# Patient Record
Sex: Male | Born: 1985
Health system: Southern US, Community
[De-identification: ages and names within clinical notes are randomized; demographics above are authoritative.]

## PROBLEM LIST (undated history)

## (undated) DIAGNOSIS — G4700 Insomnia, unspecified: Secondary | ICD-10-CM

## (undated) DIAGNOSIS — I517 Cardiomegaly: Principal | ICD-10-CM

## (undated) DIAGNOSIS — I4510 Unspecified right bundle-branch block: Secondary | ICD-10-CM

## (undated) HISTORY — PX: TONSILLECTOMY: SUR1361

## (undated) HISTORY — PX: HERNIA REPAIR: SHX51

## (undated) HISTORY — DX: Insomnia, unspecified: G47.00

## (undated) HISTORY — DX: Unspecified right bundle-branch block: I45.10

## (undated) HISTORY — DX: Cardiomegaly: I51.7

---

## 2010-06-26 DIAGNOSIS — G4700 Insomnia, unspecified: Secondary | ICD-10-CM | POA: Insufficient documentation

## 2016-03-29 MED FILL — AZITHROMYCIN 250 MG TABLET: 250 | 5 days supply | Qty: 6 | Fill #0

## 2016-03-31 ENCOUNTER — Encounter (HOSPITAL_COMMUNITY): Payer: Self-pay | Admitting: Emergency Medicine

## 2016-03-31 ENCOUNTER — Ambulatory Visit (HOSPITAL_COMMUNITY)
Admission: EM | Admit: 2016-03-31 | Discharge: 2016-03-31 | Disposition: A | Discharge status: Home / Self Care | Payer: 59 | Attending: Emergency Medicine | Admitting: Emergency Medicine

## 2016-03-31 DIAGNOSIS — R0981 Nasal congestion: Secondary | ICD-10-CM

## 2016-03-31 DIAGNOSIS — R5383 Other fatigue: Secondary | ICD-10-CM

## 2016-03-31 DIAGNOSIS — J029 Acute pharyngitis, unspecified: Secondary | ICD-10-CM

## 2016-03-31 LAB — POCT INFECTIOUS MONO SCREEN: Mono Screen: NEGATIVE

## 2016-03-31 LAB — POCT RAPID STREP A: STREPTOCOCCUS, GROUP A SCREEN (DIRECT): NEGATIVE

## 2016-03-31 MED ORDER — METHYLPREDNISOLONE ACETATE 80 MG/ML IJ SUSP
INTRAMUSCULAR | Status: AC
  Filled 2016-03-31: qty 1

## 2016-03-31 MED ORDER — METHYLPREDNISOLONE ACETATE 80 MG/ML IJ SUSP
80.0000 mg | Freq: Once | INTRAMUSCULAR | Status: AC
  Administered 2016-03-31: 80 mg via INTRAMUSCULAR

## 2016-03-31 NOTE — Discharge Instructions (Signed)
Nice to meet you. You have no clinical or lab findings of strep or mono. Suspect viral infection and /or allergies. At any rate treat you with DepoMedrol injection which will help with all above and hopefully fatigue. Suggest ibuprofen 800mg  every 8 hours for soreness in throat as well, and perhaps a Claritin or zyrtec daily through this. Hope you feel better.   Pharyngitis Pharyngitis is redness, pain, and swelling (inflammation) of your pharynx.  CAUSES  Pharyngitis is usually caused by infection. Most of the time, these infections are from viruses (viral) and are part of a cold. However, sometimes pharyngitis is caused by bacteria (bacterial). Pharyngitis can also be caused by allergies. Viral pharyngitis may be spread from person to person by coughing, sneezing, and personal items or utensils (cups, forks, spoons, toothbrushes). Bacterial pharyngitis may be spread from person to person by more intimate contact, such as kissing.  SIGNS AND SYMPTOMS  Symptoms of pharyngitis include:   Sore throat.   Tiredness (fatigue).   Low-grade fever.   Headache.  Joint pain and muscle aches.  Skin rashes.  Swollen lymph nodes.  Plaque-like film on throat or tonsils (often seen with bacterial pharyngitis). DIAGNOSIS  Your health care provider will ask you questions about your illness and your symptoms. Your medical history, along with a physical exam, is often all that is needed to diagnose pharyngitis. Sometimes, a rapid strep test is done. Other lab tests may also be done, depending on the suspected cause.  TREATMENT  Viral pharyngitis will usually get better in 3-4 days without the use of medicine. Bacterial pharyngitis is treated with medicines that kill germs (antibiotics).  HOME CARE INSTRUCTIONS   Drink enough water and fluids to keep your urine clear or pale yellow.   Only take over-the-counter or prescription medicines as directed by your health care provider:   If you are  prescribed antibiotics, make sure you finish them even if you start to feel better.   Do not take aspirin.   Get lots of rest.   Gargle with 8 oz of salt water ( tsp of salt per 1 qt of water) as often as every 1-2 hours to soothe your throat.   Throat lozenges (if you are not at risk for choking) or sprays may be used to soothe your throat. SEEK MEDICAL CARE IF:   You have large, tender lumps in your neck.  You have a rash.  You cough up green, yellow-brown, or bloody spit. SEEK IMMEDIATE MEDICAL CARE IF:   Your neck becomes stiff.  You drool or are unable to swallow liquids.  You vomit or are unable to keep medicines or liquids down.  You have severe pain that does not go away with the use of recommended medicines.  You have trouble breathing (not caused by a stuffy nose). MAKE SURE YOU:   Understand these instructions.  Will watch your condition.  Will get help right away if you are not doing well or get worse.   This information is not intended to replace advice given to you by your health care provider. Make sure you discuss any questions you have with your health care provider.   Document Released: 11/12/2005 Document Revised: 09/02/2013 Document Reviewed: 07/20/2013 Elsevier Interactive Patient Education 2016 Elsevier Inc.  Sore Throat A sore throat is pain, burning, irritation, or scratchiness of the throat. There is often pain or tenderness when swallowing or talking. A sore throat may be accompanied by other symptoms, such as coughing, sneezing, fever, and  swollen neck glands. A sore throat is often the first sign of another sickness, such as a cold, flu, strep throat, or mononucleosis (commonly known as mono). Most sore throats go away without medical treatment. CAUSES  The most common causes of a sore throat include:  A viral infection, such as a cold, flu, or mono.  A bacterial infection, such as strep throat, tonsillitis, or whooping  cough.  Seasonal allergies.  Dryness in the air.  Irritants, such as smoke or pollution.  Gastroesophageal reflux disease (GERD). HOME CARE INSTRUCTIONS   Only take over-the-counter medicines as directed by your caregiver.  Drink enough fluids to keep your urine clear or pale yellow.  Rest as needed.  Try using throat sprays, lozenges, or sucking on hard candy to ease any pain (if older than 4 years or as directed).  Sip warm liquids, such as broth, herbal tea, or warm water with honey to relieve pain temporarily. You may also eat or drink cold or frozen liquids such as frozen ice pops.  Gargle with salt water (mix 1 tsp salt with 8 oz of water).  Do not smoke and avoid secondhand smoke.  Put a cool-mist humidifier in your bedroom at night to moisten the air. You can also turn on a hot shower and sit in the bathroom with the door closed for 5-10 minutes. SEEK IMMEDIATE MEDICAL CARE IF:  You have difficulty breathing.  You are unable to swallow fluids, soft foods, or your saliva.  You have increased swelling in the throat.  Your sore throat does not get better in 7 days.  You have nausea and vomiting.  You have a fever or persistent symptoms for more than 2-3 days.  You have a fever and your symptoms suddenly get worse. MAKE SURE YOU:   Understand these instructions.  Will watch your condition.  Will get help right away if you are not doing well or get worse.   This information is not intended to replace advice given to you by your health care provider. Make sure you discuss any questions you have with your health care provider.   Document Released: 12/20/2004 Document Revised: 12/03/2014 Document Reviewed: 07/20/2012 Elsevier Interactive Patient Education [employer].

## 2016-03-31 NOTE — ED Provider Notes (Signed)
CSN: [id number]     Arrival date & time 03/31/16  1427 History   First MD Initiated Contact with Patient 03/31/16 1618     Chief Complaint  Patient presents with  . Sore Throat   (Consider location/radiation/quality/duration/timing/severity/associated sxs/prior Treatment) HPI Comments: James Kelly is a very pleasant 30 yo male who presents with a sore throat. Onset 1 week. Recently treated with a z-pack for what was thought to be a strep (no test was performed). He finished the medication today and continues to have a sore throat. He worries about mono or reoccurring strep.He has fatigue. No fever or chills. Mild nasal congestion is noted. No headaches. No cough.    Patient is a 30 y.o. male presenting with pharyngitis. The history is provided by the patient.  Sore Throat Pertinent negatives include no shortness of breath.    History reviewed. No pertinent past medical history. History reviewed. No pertinent past surgical history. No family history on file. Social History  Substance Use Topics  . Smoking status: Never Smoker   . Smokeless tobacco: None  . Alcohol Use: Yes    Review of Systems  Constitutional: Positive for fatigue. Negative for fever and chills.  HENT: Positive for congestion, sinus pressure and sore throat.   Eyes: Negative.   Respiratory: Negative for cough and shortness of breath.   Skin: Negative.   Neurological: Negative.     Allergies  Review of patient's allergies indicates not on file.  Home Medications   Prior to Admission medications   Medication Sig Start Date End Date Taking? Authorizing Provider  azithromycin (ZITHROMAX) 250 MG tablet Take by mouth daily.   Yes Historical Provider, MD   Meds Ordered and Administered this Visit   Medications  methylPREDNISolone acetate (DEPO-MEDROL) injection 80 mg (not administered)    BP 122/74 mmHg  Pulse 53  Temp(Src) 98.2 F (36.8 C) (Oral)  Resp 16  SpO2 100% No data found.   Physical Exam   Constitutional: He is oriented to person, place, and time. He appears well-developed and well-nourished. No distress.  HENT:  Head: Normocephalic and atraumatic.  Right Ear: External ear normal.  Left Ear: External ear normal.  Mouth/Throat: No oropharyngeal exudate.  Oropharynx injected without exudate or swelling, nasal turbinates slightly swollen, clear drainage  Neck: Normal range of motion. Neck supple.  Cardiovascular: Normal rate and regular rhythm.   Pulmonary/Chest: Effort normal.  Lymphadenopathy:    He has no cervical adenopathy.  Neurological: He is alert and oriented to person, place, and time.  Skin: Skin is warm and dry. No rash noted. He is not diaphoretic.  Psychiatric: His behavior is normal.  Nursing note and vitals reviewed.   ED Course  Procedures (including critical care time)  Labs Review Labs Reviewed  POCT RAPID STREP A  POCT INFECTIOUS MONO SCREEN    Imaging Review No results found.   Visual Acuity Review  Right Eye Distance:   Left Eye Distance:   Bilateral Distance:    Right Eye Near:   Left Eye Near:    Bilateral Near:         MDM   1. Pharyngitis   2. Other fatigue   3. Nasal congestion    No indication for another abx, as he was treated approiatley  with abx prior. Suggest an IM DepoMedrol for viral illness, fatigue and ongoing congestion /possible allergies. FU prn    James Pippin, PA-C 03/31/16 1708

## 2016-03-31 NOTE — ED Notes (Signed)
Pt. Stated, I've had a sore throat and was treated with a Z-Pak I finished the last dose today.  I feel like my throat is worse. 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

## 2016-04-03 LAB — CULTURE, GROUP A STREP (THRC)

## 2016-05-06 ENCOUNTER — Emergency Department (INDEPENDENT_AMBULATORY_CARE_PROVIDER_SITE_OTHER): Payer: 59

## 2016-05-06 ENCOUNTER — Emergency Department
Admission: EM | Admit: 2016-05-06 | Discharge: 2016-05-06 | Disposition: A | Discharge status: Home / Self Care | Payer: 59 | Source: Home / Self Care

## 2016-05-06 ENCOUNTER — Encounter: Payer: Self-pay | Admitting: Emergency Medicine

## 2016-05-06 DIAGNOSIS — R509 Fever, unspecified: Secondary | ICD-10-CM

## 2016-05-06 DIAGNOSIS — R0789 Other chest pain: Secondary | ICD-10-CM

## 2016-05-06 DIAGNOSIS — M791 Myalgia: Secondary | ICD-10-CM

## 2016-05-06 DIAGNOSIS — G4700 Insomnia, unspecified: Secondary | ICD-10-CM | POA: Diagnosis not present

## 2016-05-06 DIAGNOSIS — B9789 Other viral agents as the cause of diseases classified elsewhere: Principal | ICD-10-CM

## 2016-05-06 DIAGNOSIS — J069 Acute upper respiratory infection, unspecified: Secondary | ICD-10-CM

## 2016-05-06 LAB — POCT RAPID STREP A (OFFICE): RAPID STREP A SCREEN: NEGATIVE

## 2016-05-06 MED ORDER — CEFDINIR 300 MG PO CAPS: 300.0000 mg | ORAL_CAPSULE | Freq: Two times a day (BID) | ORAL | Status: DC

## 2016-05-06 MED ORDER — PREDNISONE 20 MG PO TABS: ORAL_TABLET | ORAL | Status: DC

## 2016-05-06 MED ORDER — ZOLPIDEM TARTRATE 5 MG PO TABS: 5.0000 mg | ORAL_TABLET | Freq: Every evening | ORAL | Status: DC | PRN

## 2016-05-06 MED ORDER — BENZONATATE 200 MG PO CAPS: 200.0000 mg | ORAL_CAPSULE | Freq: Every day | ORAL | Status: DC

## 2016-05-06 NOTE — Discharge Instructions (Signed)
Continue plain guaifenesin (Mucinex) with plenty of water, for cough and congestion.  May continue Pseudoephedrine for sinus congestion.  Get adequate rest.   May use Afrin nasal spray (or generic oxymetazoline) twice daily for about 5 days and then discontinue.  Also recommend using saline nasal spray several times daily and saline nasal irrigation (AYR is a common brand).  Use Flonase nasal spray each morning after using Afrin nasal spray and saline nasal irrigation. Try warm salt water gargles for sore throat.  Stop all antihistamines for now, and other non-prescription cough/cold preparations. Begin Omnicef (cefdinir) if not improving about one week or if persistent fever develops   Follow-up with family doctor if not improving about10 days.

## 2016-05-06 NOTE — ED Notes (Signed)
Reports return of congestion, sore throat, fever and aches which he was seen for 6 weeks ago. Took Ibuprofen 600mg  po at 0900 today.

## 2016-05-06 NOTE — ED Provider Notes (Signed)
CSN: [id number]     Arrival date & time 05/06/16  1122 History   None    Chief Complaint  Patient presents with  . Nasal Congestion  . Fever  . Sore Throat  . Generalized Body Aches      HPI Comments: Patient presents with two complaints: 1)  Patient complains of 4 to 5 day history of typical cold-like symptoms developing over several days,  including mild sore throat, sinus congestion, headache, fatigue, myalgias, chills/sweats, and cough.  He had had a similar illness about one month ago that had resolved. 2)  He has a long history of insomnia that has not responded over time to a variety of medications.  He feels well otherwise without symptoms of anxiety or depression.  The history is provided by the patient.    History reviewed. No pertinent past medical history. History reviewed. No pertinent past surgical history. History reviewed. No pertinent family history. Social History  Substance Use Topics  . Smoking status: Never Smoker   . Smokeless tobacco: None  . Alcohol Use: Yes    Review of Systems + sore throat + cough No pleuritic pain No wheezing + nasal congestion + post-nasal drainage No sinus pain/pressure No itchy/red eyes No earache No hemoptysis No SOB No fever, + chills/sweats No nausea No vomiting No abdominal pain No diarrhea No urinary symptoms No skin rash + fatigue + myalgias + headache + insomnia Used OTC meds without relief  Allergies  Review of patient's allergies indicates no known allergies.  Home Medications   Prior to Admission medications   Medication Sig Start Date End Date Taking? Authorizing Provider  benzonatate (TESSALON) 200 MG capsule Take 1 capsule (200 mg total) by mouth at bedtime. Take as needed for cough 05/06/16   Kandra Nicolas, MD  cefdinir (OMNICEF) 300 MG capsule Take 1 capsule (300 mg total) by mouth 2 (two) times daily. (Rx void after 05/14/16) 05/06/16   Kandra Nicolas, MD  predniSONE (DELTASONE) 20 MG tablet  Take one tab by mouth twice daily for 5 days, then one daily for 3 days. Take with food. 05/06/16   Kandra Nicolas, MD  zolpidem (AMBIEN) 5 MG tablet Take 1 tablet (5 mg total) by mouth at bedtime as needed for sleep. 05/06/16   Kandra Nicolas, MD   Meds Ordered and Administered this Visit  Medications - No data to display  BP 117/69 mmHg  Pulse 68  Temp(Src) 98 F (36.7 C) (Oral)  Resp 16  Ht 6\' 4"  (1.93 m)  Wt 190 lb (86.183 kg)  BMI 23.14 kg/m2  SpO2 97% No data found.   Physical Exam Nursing notes and Vital Signs reviewed. Appearance:  Patient appears stated age, and in no acute distress Eyes:  Pupils are equal, round, and reactive to light and accomodation.  Extraocular movement is intact.  Conjunctivae are not inflamed  Ears:  Canals normal.  Tympanic membranes normal.  Nose:  Mildly congested turbinates.  No sinus tenderness.  Pharynx:  Normal Neck:  Supple.  Tender enlarged posterior/lateral nodes are palpated bilaterally  Lungs:  Clear to auscultation.  Breath sounds are equal.  Moving air well. Heart:  Regular rate and rhythm without murmurs, rubs, or gallops.  Abdomen:  Nontender without masses or hepatosplenomegaly.  Bowel sounds are present.  No CVA or flank tenderness.  Extremities:  No edema.  Skin:  No rash present.   ED Course  Procedures none    Labs Reviewed  POCT RAPID  STREP A (OFFICE) negative    Imaging Review Dg Chest 2 View  05/06/2016  CLINICAL DATA:  Pt states that for the past 5 days he has had tightness across his upper chest with congestion, sore throat, fever and aches. EXAM: CHEST  2 VIEW COMPARISON:  None. FINDINGS: Midline trachea. Normal heart size and mediastinal contours. No pleural effusion or pneumothorax. Mild biapical pleural thickening. Clear lungs. IMPRESSION: No acute cardiopulmonary disease. Electronically Signed   By: Abigail Miyamoto M.D.   On: 05/06/2016 14:07      MDM   1. Viral URI with cough   2. Insomnia    Begin  prednisone burst/taper.  Prescription written for Benzonatate [facility]) to take at bedtime for night-time cough.  Rx for Ambien at bedtime prn (limited Rx). Continue plain guaifenesin (Mucinex) with plenty of water, for cough and congestion.  May continue Pseudoephedrine for sinus congestion.  Get adequate rest.   May use Afrin nasal spray (or generic oxymetazoline) twice daily for about 5 days and then discontinue.  Also recommend using saline nasal spray several times daily and saline nasal irrigation (AYR is a common brand).  Use Flonase nasal spray each morning after using Afrin nasal spray and saline nasal irrigation. Try warm salt water gargles for sore throat.  Stop all antihistamines for now, and other non-prescription cough/cold preparations. Begin Omnicef (cefdinir) if not improving about one week or if persistent fever develops (Given a prescription to hold, with an expiration date)  Follow-up with family doctor if not improving about10 days.  Follow-up with PCP for management of insomnia    Kandra Nicolas, MD 05/14/16 [phone]

## 2016-06-14 DIAGNOSIS — Z8249 Family history of ischemic heart disease and other diseases of the circulatory system: Secondary | ICD-10-CM | POA: Diagnosis not present

## 2016-06-14 DIAGNOSIS — F5101 Primary insomnia: Secondary | ICD-10-CM | POA: Diagnosis not present

## 2016-06-14 DIAGNOSIS — Z0000 Encounter for general adult medical examination without abnormal findings: Secondary | ICD-10-CM | POA: Diagnosis not present

## 2016-06-18 MED FILL — TEMAZEPAM 15 MG CAPSULE: 15 | 30 days supply | Qty: 30 | Fill #0

## 2016-06-25 ENCOUNTER — Encounter: Payer: Self-pay | Admitting: Cardiology

## 2016-07-09 ENCOUNTER — Ambulatory Visit: Payer: 59 | Admitting: Cardiology

## 2016-07-09 MED FILL — TEMAZEPAM 30 MG CAPSULE: 30 | 30 days supply | Qty: 30 | Fill #0

## 2016-07-11 ENCOUNTER — Encounter: Payer: Self-pay | Admitting: Cardiology

## 2016-07-24 DIAGNOSIS — G4700 Insomnia, unspecified: Secondary | ICD-10-CM | POA: Diagnosis not present

## 2016-08-07 MED FILL — TEMAZEPAM 30 MG CAPSULE: 30 | 90 days supply | Qty: 90 | Fill #0

## 2016-08-22 ENCOUNTER — Ambulatory Visit (INDEPENDENT_AMBULATORY_CARE_PROVIDER_SITE_OTHER): Payer: 59 | Admitting: Cardiology

## 2016-08-22 ENCOUNTER — Encounter: Payer: Self-pay | Admitting: Cardiology

## 2016-08-22 DIAGNOSIS — I517 Cardiomegaly: Secondary | ICD-10-CM

## 2016-08-22 DIAGNOSIS — I450 Right fascicular block: Secondary | ICD-10-CM

## 2016-08-22 DIAGNOSIS — I4510 Unspecified right bundle-branch block: Secondary | ICD-10-CM

## 2016-08-22 HISTORY — DX: Unspecified right bundle-branch block: I45.10

## 2016-08-22 HISTORY — DX: Cardiomegaly: I51.7

## 2016-08-22 NOTE — Patient Instructions (Signed)
Medication Instructions:  Your physician recommends that you continue on your current medications as directed. Please refer to the Current Medication list given to you today.   Labwork: None  Testing/Procedures: Your physician has requested that you have an echocardiogram. Echocardiography is a painless test that uses sound waves to create images of your heart. It provides your doctor with information about the size and shape of your heart and how well your heart's chambers and valves are working. This procedure takes approximately one hour. There are no restrictions for this procedure.  Follow-Up: Your physician recommends that you schedule a follow-up appointment AS NEEDED with Dr. Turner pending study results.  Any Other Special Instructions Will Be Listed Below (If Applicable).     If you need a refill on your cardiac medications before your next appointment, please call your pharmacy.   

## 2016-08-22 NOTE — Progress Notes (Signed)
Cardiology Office Note    Date:  08/22/2016   ID:  James Kelly, DOB [birth year], MRN [id number]  PCP:  Thamas Jaegers, NP  Cardiologist:  [name], MD   Chief Complaint  Patient presents with  . New Evaluation    IRBBB and LVH on EKG    History of Present Illness:  James Kelly is a 30 y.o. male with no prior cardiac history who is referred for cardiac evaluation due to IRBBB on EKG with LVH.  He recently had a PE and EKG was done and noted a IRBBB and LVH.  His Dad had WPW and underwent PPM in the past.  His grandfather died of an MI.  He exercises a lot and bikes 10 hours a week and is leaving for [place] for a race soon.  He denies any chest pain or pressure,  SOB, DOE, LE edema, dizziness, palpitations or syncope.  He has severe insomnia with circadian rhythm disorder and has developed anxiety surrounding going to bed and will get almost panicked and feel some mild discomfort in his chest.  He has no chest pain or pressure riding his bike. He has no family history of syncope or SCD.    Past Medical History:  Diagnosis Date  . Incomplete right bundle branch block (RBBB) 08/22/2016  . Insomnia    HX OF  . LVH (left ventricular hypertrophy) 08/22/2016    Past Surgical History:  Procedure Laterality Date  . HERNIA REPAIR    . TONSILLECTOMY      Current Medications: Outpatient Medications Prior to Visit  Medication Sig Dispense Refill  . benzonatate (TESSALON) 200 MG capsule Take 1 capsule (200 mg total) by mouth at bedtime. Take as needed for cough 12 capsule 0  . cefdinir (OMNICEF) 300 MG capsule Take 1 capsule (300 mg total) by mouth 2 (two) times daily. (Rx void after 05/14/16) 20 capsule 0  . predniSONE (DELTASONE) 20 MG tablet Take one tab by mouth twice daily for 5 days, then one daily for 3 days. Take with food. 13 tablet 0  . zolpidem (AMBIEN) 5 MG tablet Take 1 tablet (5 mg total) by mouth at bedtime as needed for sleep. 15 tablet 0   No  facility-administered medications prior to visit.      Allergies:   Review of patient's allergies indicates no known allergies.   Social History   Social History  . Marital status: Single    Spouse name: N/A  . Number of children: N/A  . Years of education: N/A   Social History Main Topics  . Smoking status: Never Smoker  . Smokeless tobacco: Never Used  . Alcohol use Yes  . Drug use: No  . Sexual activity: Not Asked   Other Topics Concern  . None   Social History Narrative  . None     Family History:  The patient's family history includes Heart disease in his father and paternal grandmother; Thyroid disease in his maternal grandmother; Yves Dill Parkinson White syndrome in his father.   ROS:   Please see the history of present illness.    ROS All other systems reviewed and are negative.  No flowsheet data found.     PHYSICAL EXAM:   VS:  BP 120/80 (BP Location: Right Arm, Patient Position: Sitting, Cuff Size: Normal)   Pulse 62   Ht 6\' 4"  (1.93 m)   Wt 195 lb 12.8 oz (88.8 kg)   BMI 23.83 kg/m    GEN:  Well nourished, well developed, in no acute distress  HEENT: normal  Neck: no JVD, carotid bruits, or masses Cardiac: RRR; no murmurs, rubs, or gallops,no edema.  Intact distal pulses bilaterally.  Respiratory:  clear to auscultation bilaterally, normal work of breathing GI: soft, nontender, nondistended, + BS MS: no deformity or atrophy  Skin: warm and dry, no rash Neuro:  Alert and Oriented x 3, Strength and sensation are intact Psych: euthymic mood, full affect  Wt Readings from Last 3 Encounters:  08/22/16 195 lb 12.8 oz (88.8 kg)  05/06/16 190 lb (86.2 kg)      Studies/Labs Reviewed:   EKG:  EKG is ordered today.  The ekg ordered today demonstrates NSR at 62bpm with IRBBB and LVH by voltage  Recent Labs: No results found for requested labs within last 8760 hours.   Lipid Panel No results found for: CHOL, TRIG, HDL, CHOLHDL, VLDL, LDLCALC,  LDLDIRECT  Additional studies/ records that were reviewed today include:  Office notes from PCP    ASSESSMENT:    1. LVH (left ventricular hypertrophy)   2. Incomplete right bundle branch block (RBBB)      PLAN:  In order of problems listed above:  1. LVH - this is noted on EKG - I will check a 2D echo to assess further 2. IRBBB - I will get a 2D echo to assess LVF and rule out PFO that can sometimes be associated with this.  He bikes 10 hours weekly with no problems so no ETT needed at this time.    Medication Adjustments/Labs and Tests Ordered: Current medicines are reviewed at length with the patient today.  Concerns regarding medicines are outlined above.  Medication changes, Labs and Tests ordered today are listed in the Patient Instructions below.  There are no Patient Instructions on file for this visit.   Signed, [name], MD  08/22/2016 10:35 AM    [address], [place], [place]  [address] Phone: [phone]; Fax: [phone]

## 2016-08-23 ENCOUNTER — Encounter: Payer: Self-pay | Admitting: Cardiology

## 2016-08-24 ENCOUNTER — Ambulatory Visit (HOSPITAL_COMMUNITY)
Admission: RE | Admit: 2016-08-24 | Discharge: 2016-08-24 | Disposition: A | Discharge status: Home / Self Care | Payer: 59 | Source: Ambulatory Visit | Attending: Cardiology | Admitting: Cardiology

## 2016-08-24 DIAGNOSIS — I450 Right fascicular block: Secondary | ICD-10-CM | POA: Diagnosis not present

## 2016-08-24 DIAGNOSIS — I4510 Unspecified right bundle-branch block: Secondary | ICD-10-CM | POA: Diagnosis not present

## 2016-08-24 DIAGNOSIS — I517 Cardiomegaly: Secondary | ICD-10-CM | POA: Diagnosis not present

## 2016-08-24 NOTE — Progress Notes (Signed)
*  PRELIMINARY RESULTS* Echocardiogram 2D Echocardiogram has been performed.  Leavy Cella 08/24/2016, 10:46 AM

## 2016-11-04 ENCOUNTER — Encounter: Payer: Self-pay | Admitting: Cardiology

## 2016-11-08 ENCOUNTER — Telehealth: Payer: Self-pay

## 2016-11-08 NOTE — Telephone Encounter (Signed)
Echo showed LVH and normal heart function. The IRBBB can be a normal finding. Patient has LVH with no HTN. Please get a cardiac MRI to evaluate LVH further and rule out hypertrophic CM.    [name]  ----- Message -----  From: Theodoro Parma, RN  Sent: 11/05/2016  9:53 AM  To: Sueanne Margarita, MD  Subject: FW: Visit Follow-Up Question                 ----- Message -----  From: Nuala Alpha, LPN  Sent: [id number]  8:14 AM  To: Theodoro Parma, RN  Subject: FW: Visit Follow-Up Question                 ----- Message -----  From: Willene Hatchet  Sent: 11/04/2016 10:41 PM  To: [facility] Triage  Subject: Visit Follow-Up Question               Dr. Radford Pax,    I received a very generic follow-up call that my ECHO was normal. To my understanding, what prompted [place] getting the ECHO was an abnormal ECG performed in the office (LVH and RBBB). Do I need to schedule a follow-up visit to discuss what is causing the abnormal ECG or is someone going to follow-up with me to explain further? Those findings are very concerning to me.    Thank you,  James Kelly    Left message to call back to discuss results in further detail and Dr. Theodosia Blender recommendations.

## 2016-11-29 NOTE — Telephone Encounter (Signed)
MyChart message sent to patient to follow-up.  See messages.

## 2016-12-17 ENCOUNTER — Telehealth: Payer: Self-pay | Admitting: Cardiology

## 2016-12-17 DIAGNOSIS — G4721 Circadian rhythm sleep disorder, delayed sleep phase type: Secondary | ICD-10-CM | POA: Diagnosis not present

## 2016-12-17 NOTE — Telephone Encounter (Signed)
Christophe called to discuss cardiac MRI.   Per Dr. Theodosia Blender notes,  Echo showed LVH and normal heart function. The IRBBB can be a normal finding. Patient has LVH with no HTN. Please get a cardiac MRI to evaluate LVH further and rule out hypertrophic CM.    [name]   The patient read his ECHO report in full detail and has discussed findings with his PCP.  He wants to know if LVH was seen only in his EKG or if it was verified in the ECHO report. He and his PCP were confused as to why an MRI would be ordered when there is no evidence of LVH in ECHO images and he has no other comorbidities.  They also discussed the possibility of exercise induced LVH if it was confirmed in ECHO. He runs and exercises vigorously and read online that he would have to stop exercising for 6 months for reversal. He states he could try this and repeat ECHO in 6 months if he does have confirmed LVH, not just seen on the EKG.   To Dr. Radford Pax for recommendations.

## 2016-12-17 NOTE — Telephone Encounter (Signed)
New message  ° ° °Pt verbalized that he is returning call for rn °

## 2016-12-18 NOTE — Telephone Encounter (Signed)
Left message to call back  

## 2016-12-18 NOTE — Telephone Encounter (Signed)
EKG was only thing that showed LVH.  Echo showed low normal LVF.  OK to hold off on Cardiac MRI at this time.  Repeat echo in 1 year to make sure EF has not declined since low normal on this echo.

## 2016-12-25 DIAGNOSIS — I4510 Unspecified right bundle-branch block: Secondary | ICD-10-CM

## 2016-12-25 NOTE — Telephone Encounter (Signed)
See MyChart message

## 2017-01-02 NOTE — Telephone Encounter (Signed)
Repeat ECHO ordered to be scheduled in 1 year. 

## 2017-01-16 DIAGNOSIS — H938X2 Other specified disorders of left ear: Secondary | ICD-10-CM | POA: Diagnosis not present

## 2017-01-16 DIAGNOSIS — D170 Benign lipomatous neoplasm of skin and subcutaneous tissue of head, face and neck: Secondary | ICD-10-CM | POA: Diagnosis not present

## 2017-01-16 DIAGNOSIS — F5101 Primary insomnia: Secondary | ICD-10-CM | POA: Diagnosis not present

## 2017-01-21 DIAGNOSIS — D225 Melanocytic nevi of trunk: Secondary | ICD-10-CM | POA: Diagnosis not present

## 2017-01-21 DIAGNOSIS — L7211 Pilar cyst: Secondary | ICD-10-CM | POA: Diagnosis not present

## 2017-02-04 MED FILL — ZOLPIDEM TART ER 6.25 MG TA: 6.25 | 30 days supply | Qty: 20 | Fill #0

## 2017-02-12 MED FILL — ESCITALOPRAM 10 MG TABLET: 10 | 30 days supply | Qty: 30 | Fill #0

## 2017-02-19 DIAGNOSIS — L7211 Pilar cyst: Secondary | ICD-10-CM | POA: Diagnosis not present

## 2017-03-12 MED FILL — ESCITALOPRAM 10 MG TABLET: 10 | 30 days supply | Qty: 30 | Fill #1

## 2017-03-12 MED FILL — ZOLPIDEM TART ER 6.25 MG TA: 6.25 | 30 days supply | Qty: 20 | Fill #1

## 2017-03-25 DIAGNOSIS — H9312 Tinnitus, left ear: Secondary | ICD-10-CM | POA: Diagnosis not present

## 2017-03-25 DIAGNOSIS — F5101 Primary insomnia: Secondary | ICD-10-CM | POA: Diagnosis not present

## 2017-03-25 DIAGNOSIS — F320 Major depressive disorder, single episode, mild: Secondary | ICD-10-CM | POA: Diagnosis not present

## 2017-04-16 DIAGNOSIS — J310 Chronic rhinitis: Secondary | ICD-10-CM | POA: Diagnosis not present

## 2017-04-16 DIAGNOSIS — H6983 Other specified disorders of Eustachian tube, bilateral: Secondary | ICD-10-CM | POA: Diagnosis not present

## 2017-04-16 DIAGNOSIS — H6993 Unspecified Eustachian tube disorder, bilateral: Secondary | ICD-10-CM | POA: Insufficient documentation

## 2017-04-16 MED FILL — MOMETASONE FUROATE 50 MCG S: 50 | 30 days supply | Qty: 17 | Fill #0

## 2017-07-20 ENCOUNTER — Ambulatory Visit (INDEPENDENT_AMBULATORY_CARE_PROVIDER_SITE_OTHER): Payer: 59 | Admitting: Family Medicine

## 2017-07-20 ENCOUNTER — Encounter: Payer: Self-pay | Admitting: Family Medicine

## 2017-07-20 VITALS — BP 119/70 | HR 57 | Temp 97.9°F | Resp 16 | Ht 75.25 in | Wt 212.4 lb

## 2017-07-20 DIAGNOSIS — L02419 Cutaneous abscess of limb, unspecified: Secondary | ICD-10-CM | POA: Diagnosis not present

## 2017-07-20 DIAGNOSIS — Z23 Encounter for immunization: Secondary | ICD-10-CM | POA: Diagnosis not present

## 2017-07-20 DIAGNOSIS — S91001A Unspecified open wound, right ankle, initial encounter: Secondary | ICD-10-CM

## 2017-07-20 DIAGNOSIS — L03119 Cellulitis of unspecified part of limb: Secondary | ICD-10-CM

## 2017-07-20 DIAGNOSIS — S81801A Unspecified open wound, right lower leg, initial encounter: Secondary | ICD-10-CM | POA: Diagnosis not present

## 2017-07-20 DIAGNOSIS — S81001A Unspecified open wound, right knee, initial encounter: Secondary | ICD-10-CM

## 2017-07-20 MED ORDER — SULFAMETHOXAZOLE-TRIMETHOPRIM 800-160 MG PO TABS: 2.0000 | ORAL_TABLET | Freq: Two times a day (BID) | ORAL | 0 refills | Status: DC

## 2017-07-20 MED ORDER — MUPIROCIN 2 % EX OINT: 1.0000 "application " | TOPICAL_OINTMENT | Freq: Two times a day (BID) | CUTANEOUS | 1 refills | Status: DC

## 2017-07-20 NOTE — Progress Notes (Signed)
By signing my name below, I, James Kelly, attest that this documentation has been prepared under the direction and in the presence of James Cheadle, MD.  Electronically Signed: Verlee Kelly, Medical Scribe. 07/20/17. 12:06 PM.  Subjective:    Patient ID: James Kelly, male    DOB: [birth year], 31 y.o.   MRN: [id number]  HPI Chief Complaint  Patient presents with  . Cellulitis    Right Ankle    HPI Comments: James Kelly is a 31 y.o. male who presents to Primary Care at [facility] complaining of possible cellulitis on his medial right ankle. Reports associated symptom of chills last night, swelling onset last yesterday, painful ambulation, and erythematous border. Pt scrapped his leg on the concrete edge when cut his ankle. It bleed a lot but it felt like a minor scrape so pt was able to ambulate without difficulty. He immediately used peroxide and a triple abx with it covered the first day, but he's been keeping it open since. Pt doesn't think he injured his ankle. Denies red streaks, abdominal problems, or recently being on abx.  Patient Active Problem List   Diagnosis Date Noted  . LVH (left ventricular hypertrophy) 08/22/2016  . Incomplete right bundle branch block (RBBB) 08/22/2016   Past Medical History:  Diagnosis Date  . Incomplete right bundle branch block (RBBB) 08/22/2016  . Insomnia    HX OF  . LVH (left ventricular hypertrophy) 08/22/2016   Past Surgical History:  Procedure Laterality Date  . HERNIA REPAIR    . TONSILLECTOMY     No Known Allergies Prior to Admission medications   Medication Sig Start Date End Date Taking? Authorizing Provider  zolpidem (AMBIEN CR) 6.25 MG CR tablet Take 6.25 mg by mouth at bedtime as needed.  02/04/17  Yes [provider]   Family History  Problem Relation Age of Onset  . Hypertension Mother   . Heart disease Father   . [place] White syndrome Father   . Hypertension Father   . Thyroid disease Maternal  Grandmother   . Cancer Maternal Grandmother   . Heart disease Paternal Grandmother   . Hyperlipidemia Paternal Grandmother   . Cancer Maternal Grandfather   . Heart disease Paternal Grandfather   . Hypertension Paternal Grandfather     Social History   Social History  . Marital status: Single    Spouse name: N/A  . Number of children: N/A  . Years of education: N/A   Occupational History  . Not on file.   Social History Main Topics  . Smoking status: Never Smoker  . Smokeless tobacco: Never Used  . Alcohol use Yes  . Drug use: No  . Sexual activity: Not on file   Other Topics Concern  . Not on file   Social History Narrative  . No narrative on file   Depression screen [facility] 2/9 07/22/2017 07/20/2017  Decreased Interest 0 0  Down, Depressed, Hopeless 0 0  PHQ - 2 Score 0 0    Review of Systems  Constitutional: Positive for chills. Negative for fever.  Gastrointestinal: Negative for abdominal pain.  Musculoskeletal: Negative for arthralgias.  Skin: Positive for color change and wound.   Objective:  Physical Exam  Constitutional: He appears well-developed and well-nourished. No distress.  HENT:  Head: Normocephalic and atraumatic.  Eyes: Conjunctivae are normal.  Neck: Neck supple.  Cardiovascular: Normal rate.   Pulses:      Dorsalis pedis pulses are 2+ on the right side, and  2+ on the left side.  Pulmonary/Chest: Effort normal.  Neurological: He is alert.  Skin: Skin is warm and dry. Abrasion noted.  Mild warmth with significant tenderness 15 mm abrasion with 6 cm of surrounding erythema and induration  Psychiatric: He has a normal mood and affect. His behavior is normal.  Nursing note and vitals reviewed.   Vitals:   07/20/17 1203  BP: 119/70  Pulse: (!) 57  Resp: 16  Temp: 97.9 F (36.6 C)  TempSrc: Oral  SpO2: 99%  Weight: 212 lb 6 oz (96.3 kg)  Height: 6' 3.25" (1.911 m)  Body mass index is 26.37 kg/m. Assessment & Plan:   1. Open wound of  right knee, leg, and ankle with complication, initial encounter   2. Cellulitis and abscess of leg     Orders Placed This Encounter  Procedures  . Tdap vaccine greater than or equal to 7yo IM    Meds ordered this encounter  Medications  . zolpidem (AMBIEN CR) 6.25 MG CR tablet    Sig: Take 6.25 mg by mouth at bedtime as needed.   . sulfamethoxazole-trimethoprim (BACTRIM DS,SEPTRA DS) 800-160 MG tablet    Sig: Take 2 tablets by mouth 2 (two) times daily.    Dispense:  28 tablet    Refill:  0  . mupirocin ointment (BACTROBAN) 2 %    Sig: Apply 1 application topically 2 (two) times daily.    Dispense:  30 g    Refill:  1    I personally performed the services described in this documentation, which was scribed in my presence. The recorded information has been reviewed and considered, and addended by me as needed.   James Kelly, M.D.  Primary Care at [facility] [address] [place], [place] [address] [phone] phone [phone] fax  07/22/17 10:15 PM

## 2017-07-20 NOTE — Patient Instructions (Addendum)
Elevated, heat. Take 2 tabs of the bactrim twice a day - get all 4 in today. And I will see you back in 48 hours to ensure improvement - come to the 102 office to get an xray first around 10:20 and then come down to 104 for your appointment with me at 10:40.   IF you received an x-ray today, you will receive an invoice from [facility]. Please contact [facility] at [phone] with questions or concerns regarding your invoice.   IF you received labwork today, you will receive an invoice from [place]. Please contact [facility] at [phone] with questions or concerns regarding your invoice.   Our billing staff will not be able to assist you with questions regarding bills from these companies.  You will be contacted with the lab results as soon as they are available. The fastest way to get your results is to activate your My Chart account. Instructions are located on the last page of this paperwork. If you have not heard from [place] regarding the results in 2 weeks, please contact this office.   Deep Skin Avulsion A deep skin avulsion is a type of open wound. It often results from a severe injury (trauma) that tears away all layers of the skin or an entire body part. The areas of the body that are most often affected by a deep skin avulsion include the face, lips, ears, nose, and fingers. A deep skin avulsion may make structures below the skin become visible. You may be able to see muscle, bone, nerves, and blood vessels. A deep skin avulsion can also damage important structures beneath the skin. These include tendons, ligaments, nerves, or blood vessels. What are the causes? Injuries that often cause a deep skin avulsion include:  Being crushed.  Falling against a jagged surface.  Animal bites.  Gunshot wounds.  Severe burns.  Injuries that involve being dragged, such as bicycle or motorcycle accidents.  What are the signs or symptoms? Symptoms of a deep skin avulsion  include:  Pain.  Numbness.  Swelling.  A misshapen body part.  Bleeding, which may be heavy.  Fluid leaking from the wound.  How is this diagnosed? This condition may be diagnosed with a medical history and physical exam. You may also have X-rays done. How is this treated? The treatment that is chosen for a deep skin avulsion depends on how large and deep the wound is and where it is located. Treatment for all types of avulsions usually starts with:  Controlling the bleeding.  Washing out the wound with a germ-free (sterile) salt-water solution.  Removing dead tissue from the wound.  A wound may be closed or left open to heal. This depends on the size and location of the wound and whether it is likely to become infected. Wounds are usually covered or closed if they expose blood vessels, nerves, bone, or cartilage.  Wounds that are small and clean may be closed with stitches (sutures).  Wounds that cannot be closed with sutures may be covered with a piece of skin (graft) or a skin flap. Skin may be taken from on or near the wound, from another part of the body, or from a donor.  Wounds may be left open if they are hard to close or they may become infected. These wounds heal over time from the bottom up.  You may also receive medicine. This may include:  Antibiotics.  A tetanus shot.  Rabies vaccine.  Follow these instructions at home: Medicines  Take or apply over-the-counter and prescription medicines only as told by your health care provider.  If you were prescribed an antibiotic, take or apply it as told by your health care provider. Do not stop taking the antibiotic even if your condition improves.  You may get anti-itch medicine while your wound is healing. Use it only as told by your health care provider. Wound care  There are many ways to close and cover a wound. For example, a wound can be covered with sutures, skin glue, or adhesive strips. Follow  instructions from your health care provider about: ? How to take care of your wound. ? When and how you should change your bandage (dressing). ? When you should remove your dressing. ? Removing whatever was used to close your wound.  Keep the dressing dry as told by your health care provider. Do not take baths, swim, use a hot tub, or do anything that would put your wound underwater until your health care provider approves.  Clean the wound each day or as told by your health care provider. ? Wash the wound with mild soap and water. ? Rinse the wound with water to remove all soap. ? Pat the wound dry with a clean towel. Do not rub it.  Do not scratch or pick at the wound.  Check your wound every day for signs of infection. Watch for: ? Redness, swelling, or pain. ? Fluid, blood, or pus. General instructions  Raise (elevate) the injured area above the level of your heart while you are sitting or lying down.  Keep all follow-up visits as told by your health care provider. This is important. Contact a health care provider if:  You received a tetanus shot and you have swelling, severe pain, redness, or bleeding at the injection site.  You have a fever.  Your pain is not controlled with medicine.  You have increased redness, swelling, or pain at the site of your wound.  You have fluid, blood, or pus coming from your wound.  You notice a bad smell coming from your wound or your dressing.  A wound that was closed breaks open.  You notice something coming out of the wound, such as wood or glass.  You notice a change in the color of your skin near your wound.  You develop a new rash.  You need to change the dressing frequently due to fluid, blood, or pus draining from the wound. Get help right away if:  Your pain suddenly increases and is severe.  You develop severe swelling around the wound.  You develop numbness around the wound.  You have nausea and vomiting that does  not go away after 24 hours.  You feel light-headed, weak, or faint.  You develop chest pain.  You have trouble breathing.  Your wound is on your hand or foot and you cannot properly move a finger or toe.  The wound is on your hand or foot and you notice that your fingers or toes look pale or bluish.  You have a red streak going away from your wound. This information is not intended to replace advice given to you by your health care provider. Make sure you discuss any questions you have with your health care provider. Document Released: 01/08/2007 Document Revised: 04/19/2016 Document Reviewed: 11/17/2014 Elsevier Interactive Patient Education  2018 [employer].   Cellulitis, Adult Cellulitis is a skin infection. The infected area is usually red and tender. This condition occurs most often in the  arms and lower legs. The infection can travel to the muscles, blood, and underlying tissue and become serious. It is very important to get treated for this condition. What are the causes? Cellulitis is caused by bacteria. The bacteria enter through a break in the skin, such as a cut, burn, insect bite, open sore, or crack. What increases the risk? This condition is more likely to occur in people who:  Have a weak defense system (immune system).  Have open wounds on the skin such as cuts, burns, bites, and scrapes. Bacteria can enter the body through these open wounds.  Are older.  Have diabetes.  Have a type of long-lasting (chronic) liver disease (cirrhosis) or kidney disease.  Use IV drugs.  What are the signs or symptoms? Symptoms of this condition include:  Redness, streaking, or spotting on the skin.  Swollen area of the skin.  Tenderness or pain when an area of the skin is touched.  Warm skin.  Fever.  Chills.  Blisters.  How is this diagnosed? This condition is diagnosed based on a medical history and physical exam. You may also have tests, including:  Blood  tests.  Lab tests.  Imaging tests.  How is this treated? Treatment for this condition may include:  Medicines, such as antibiotic medicines or antihistamines.  Supportive care, such as rest and application of cold or warm cloths (cold or warm compresses) to the skin.  Hospital care, if the condition is severe.  The infection usually gets better within 1-2 days of treatment. Follow these instructions at home:  Take over-the-counter and prescription medicines only as told by your health care provider.  If you were prescribed an antibiotic medicine, take it as told by your health care provider. Do not stop taking the antibiotic even if you start to feel better.  Drink enough fluid to keep your urine clear or pale yellow.  Do not touch or rub the infected area.  Raise (elevate) the infected area above the level of your heart while you are sitting or lying down.  Apply warm or cold compresses to the affected area as told by your health care provider.  Keep all follow-up visits as told by your health care provider. This is important. These visits let your health care provider make sure a more serious infection is not developing. Contact a health care provider if:  You have a fever.  Your symptoms do not improve within 1-2 days of starting treatment.  Your bone or joint underneath the infected area becomes painful after the skin has healed.  Your infection returns in the same area or another area.  You notice a swollen bump in the infected area.  You develop new symptoms.  You have a general ill feeling (malaise) with muscle aches and pains. Get help right away if:  Your symptoms get worse.  You feel very sleepy.  You develop vomiting or diarrhea that persists.  You notice red streaks coming from the infected area.  Your red area gets larger or turns dark in color. This information is not intended to replace advice given to you by your health care provider. Make sure  you discuss any questions you have with your health care provider. Document Released: 08/22/2005 Document Revised: 03/22/2016 Document Reviewed: 09/21/2015 Elsevier Interactive Patient Education  2017 [employer].

## 2017-07-21 NOTE — Progress Notes (Signed)
Subjective:    Patient ID: James Kelly, male    DOB: [birth year], 31 y.o.   MRN: [id number]  HPI  James Kelly is a delightful 31 yo male who is here for a 2d recheck of a right medial malleolar injury he sustained approx 5-6 d ago (was lifting appliance - helping a neighbor move in and ankle slid off and abraded against concrete curb. He could ambulate immediately after injury with only mild to moderate nonlocalized pain. However 3 days ago which was 2-3 days after the injury he developed acute onset of severely worsening pain, swelling, drainage, erythema around the medial malleolus are wound accompanied by systemic chills. Was seen by myself the following day and started on Bactrim 2 tabs twice a day. The area was outlined and he is here for recheck. He is doing wound care as instructed with once daily dressing changes using Hibiclens and water to clean followed by topical mupirocin. Has kept her leg elevated with frequent topical heat over the weekend. Improving. Pain decreasing. No systemic sxs.  Past Medical History:  Diagnosis Date  . Incomplete right bundle branch block (RBBB) 08/22/2016  . Insomnia    HX OF  . LVH (left ventricular hypertrophy) 08/22/2016   Past Surgical History:  Procedure Laterality Date  . HERNIA REPAIR    . TONSILLECTOMY     Current Outpatient Prescriptions on File Prior to Visit  Medication Sig Dispense Refill  . mupirocin ointment (BACTROBAN) 2 % Apply 1 application topically 2 (two) times daily. 30 g 1  . sulfamethoxazole-trimethoprim (BACTRIM DS,SEPTRA DS) 800-160 MG tablet Take 2 tablets by mouth 2 (two) times daily. 28 tablet 0  . zolpidem (AMBIEN CR) 6.25 MG CR tablet Take 6.25 mg by mouth at bedtime as needed.      No current facility-administered medications on file prior to visit.    No Known Allergies Family History  Problem Relation Age of Onset  . Hypertension Mother   . Heart disease Father   . [place] White syndrome Father   .  Hypertension Father   . Thyroid disease Maternal Grandmother   . Cancer Maternal Grandmother   . Heart disease Paternal Grandmother   . Hyperlipidemia Paternal Grandmother   . Cancer Maternal Grandfather   . Heart disease Paternal Grandfather   . Hypertension Paternal Grandfather    Social History   Social History  . Marital status: Single    Spouse name: N/A  . Number of children: N/A  . Years of education: N/A   Social History Main Topics  . Smoking status: Never Smoker  . Smokeless tobacco: Never Used  . Alcohol use Yes  . Drug use: No  . Sexual activity: Not Asked   Other Topics Concern  . None   Social History Narrative  . None   Depression screen [facility] 2/9 07/22/2017 07/20/2017  Decreased Interest 0 0  Down, Depressed, Hopeless 0 0  PHQ - 2 Score 0 0    Review of Systems See hpi    Objective:   Physical Exam  Constitutional: He is oriented to person, place, and time. He appears well-developed and well-nourished. No distress.  HENT:  Head: Normocephalic and atraumatic.  Eyes: No scleral icterus.  Cardiovascular:  Pulses:      Dorsalis pedis pulses are 2+ on the right side.       Posterior tibial pulses are 2+ on the right side.  Pulmonary/Chest: Effort normal.  Neurological: He is alert and oriented to person, place,  and time.  Skin: Skin is warm and dry. Lesion and rash noted. He is not diaphoretic.  2 cm circular ulceration over right medial malleolus with white necrotic base covering healthy pink granulation tissue around edges  Psychiatric: He has a normal mood and affect. His behavior is normal.      BP 118/74   Pulse 66   Temp 98.4 F (36.9 C) (Oral)   Resp 18   Ht 6' 3.25" (1.911 m)   Wt 209 lb 12.8 oz (95.2 kg)   SpO2 97%   BMI 26.05 kg/m   Dg Ankle Complete Right  Result Date: 07/22/2017 CLINICAL DATA:  Injury. EXAM: RIGHT ANKLE - COMPLETE 3+ VIEW COMPARISON:  No recent prior. FINDINGS: Diffuse soft tissue swelling noted. No evidence of  fracture or dislocation. IMPRESSION: Diffuse mild soft tissue swelling.  No acute abnormality. Electronically Signed   By: [place]   On: 07/22/2017 10:16       Assessment & Plan:   1. Cellulitis and abscess of leg   2. Open wound of right knee, leg, and ankle with complication, subsequent encounter    Sig improvement. Decrease bactrim to 1 tab bid to complete 10d course. Anesthetize w/ topical lidocaine prior to gentle wound debridement daily when cleansing. RTC  prn  Orders Placed This Encounter  Procedures  . DG Ankle Complete Right    Standing Status:   Future    Number of Occurrences:   1    Standing Expiration Date:   07/21/2018    Order Specific Question:   Reason for Exam (SYMPTOM  OR DIAGNOSIS REQUIRED)    Answer:   injury to medial malleolus almost 1 wk prior - deep tissue avulsion - with pain prog worsening, developed cellulitis 3d prior, started on abx 2d prior    Order Specific Question:   Preferred imaging location?    Answer:   External    Meds ordered this encounter  Medications  . lidocaine (XYLOCAINE) 5 % ointment    Sig: Apply 1 application topically 2 (two) times daily as needed.    Dispense:  50 g    Refill:  0    [name], M.D.  Primary Care at [facility] [address] [place], [place] [address] [phone] phone [phone] fax  07/23/17 11:20 PM

## 2017-07-22 ENCOUNTER — Ambulatory Visit (INDEPENDENT_AMBULATORY_CARE_PROVIDER_SITE_OTHER): Payer: 59

## 2017-07-22 ENCOUNTER — Ambulatory Visit (INDEPENDENT_AMBULATORY_CARE_PROVIDER_SITE_OTHER): Payer: 59 | Admitting: Family Medicine

## 2017-07-22 ENCOUNTER — Encounter: Payer: Self-pay | Admitting: Family Medicine

## 2017-07-22 ENCOUNTER — Other Ambulatory Visit: Payer: Self-pay | Admitting: *Deleted

## 2017-07-22 VITALS — BP 118/74 | HR 66 | Temp 98.4°F | Resp 18 | Ht 75.25 in | Wt 209.8 lb

## 2017-07-22 DIAGNOSIS — L03119 Cellulitis of unspecified part of limb: Secondary | ICD-10-CM

## 2017-07-22 DIAGNOSIS — S81001D Unspecified open wound, right knee, subsequent encounter: Secondary | ICD-10-CM

## 2017-07-22 DIAGNOSIS — S91001D Unspecified open wound, right ankle, subsequent encounter: Secondary | ICD-10-CM

## 2017-07-22 DIAGNOSIS — M7989 Other specified soft tissue disorders: Secondary | ICD-10-CM | POA: Diagnosis not present

## 2017-07-22 DIAGNOSIS — S99911A Unspecified injury of right ankle, initial encounter: Secondary | ICD-10-CM | POA: Diagnosis not present

## 2017-07-22 DIAGNOSIS — L02419 Cutaneous abscess of limb, unspecified: Secondary | ICD-10-CM

## 2017-07-22 DIAGNOSIS — S81801D Unspecified open wound, right lower leg, subsequent encounter: Secondary | ICD-10-CM

## 2017-07-22 MED ORDER — LIDOCAINE 5 % EX OINT: 1.0000 "application " | TOPICAL_OINTMENT | Freq: Two times a day (BID) | CUTANEOUS | 0 refills | Status: DC | PRN

## 2017-07-22 NOTE — Patient Instructions (Addendum)
IF you received an x-ray today, you will receive an invoice from [facility]. Please contact [facility] at [phone] with questions or concerns regarding your invoice.   IF you received labwork today, you will receive an invoice from [place]. Please contact [facility] at [phone] with questions or concerns regarding your invoice.   Our billing staff will not be able to assist you with questions regarding bills from these companies.  You will be contacted with the lab results as soon as they are available. The fastest way to get your results is to activate your My Chart account. Instructions are located on the last page of this paperwork. If you have not heard from [place] regarding the results in 2 weeks, please contact this office.     Mechanical Wound Debridement Mechanical wound debridement is a treatment to remove dead tissue from a wound. This helps the wound heal. The treatment involves cleaning the wound (irrigation) and using a pad or gauze (dressing) to remove dead tissue and debris from the wound. There are different types of mechanical wound debridement. Depending on the wound, you may need to repeat this procedure or change to another form of debridement as your wound starts to heal. Tell a health care provider about:  Any allergies you have.  All medicines you are taking, including vitamins, herbs, eye drops, creams, and over-the-counter medicines.  Any blood disorders you have.  Any medical conditions you have, including any conditions that: ? Cause a significant decrease in blood circulation to the part of the body where the wound is, such as peripheral vascular disease. ? Compromise your defense (immune) system or white blood count.  Any surgeries you have had.  Whether you are pregnant or may be pregnant. What are the risks? Generally, this is a safe procedure. However, problems may occur, including:  Infection.  Bleeding.  Damage to  healthy tissue in and around your wound.  Soreness or pain.  Failure of the wound to heal.  Scarring.  What happens before the procedure? You may be given antibiotic medicine to help prevent infection. What happens during the procedure?  Your health care provider may apply a numbing medicine (topical anesthetic) to the wound.  Your health care provider will irrigate your wound with a germ-free (sterile), salt-water (saline) solution. This removes debris, bacteria, and dead tissue.  Depending on what type of mechanical wound debridement you are having, your health care provider may do one of the following: ? Put a dressing on your wound. You may have dry gauze pad placed into the wound. Your health care provider will remove the gauze after the wound is dry. Any dead tissue and debris that has dried into the gauze will be lifted out of the wound (wet-to-dry debridement). ? Use a type of pad (monofilament fiber debridement pad). This pad has a fluffy surface on one side that picks up dead tissue and debris from your wound. Your health care provider wets the pad and wipes it over your wound for several minutes. ? Irrigate your wound with a pressurized stream of solution such as saline or water.  Once your health care provider is finished, he or she may apply a light dressing to your wound. The procedure may vary among health care providers and hospitals. What happens after the procedure?  You may receive medicine for pain.  You will continue to receive antibiotic medicine if it was started before your procedure. This information is not intended to replace advice given  to you by your health care provider. Make sure you discuss any questions you have with your health care provider. Document Released: 08/03/2015 Document Revised: 04/19/2016 Document Reviewed: 03/23/2015 Elsevier Interactive Patient Education  [employer].

## 2017-07-23 ENCOUNTER — Telehealth: Payer: Self-pay

## 2017-07-23 MED ORDER — LIDOCAINE VISCOUS HCL 2 % MT SOLN: OROMUCOSAL | 0 refills | Status: DC

## 2017-07-23 MED ORDER — LIDOCAINE HCL 3 % EX CREA: 1.0000 "application " | TOPICAL_CREAM | CUTANEOUS | 0 refills | Status: DC | PRN

## 2017-07-23 NOTE — Telephone Encounter (Signed)
Received fax from [place] that the patient needs to do a trial of the liodcaine 3% cream before the Lidocaine 5% ointment. Okay to make this change ?

## 2017-07-23 NOTE — Telephone Encounter (Signed)
Talked w/ pharmacy - they do not have the 3% cream in stock, will have to change to 2% viscous

## 2017-12-03 ENCOUNTER — Encounter (HOSPITAL_COMMUNITY): Payer: Self-pay | Admitting: Radiology

## 2017-12-13 ENCOUNTER — Telehealth (HOSPITAL_COMMUNITY): Payer: Self-pay | Admitting: Cardiology

## 2017-12-13 NOTE — Telephone Encounter (Signed)
to be scheduled 11/2017 11/21/17 LMOM to schedule echo evd 11/27/17 LMOM to schedule echo EVD 11/29/17 lmsg to CB to sch echo..RG 12/02/17 lmsg for pt to CB to sch echo.Vassie Moment 12/03/17 mail letter EVD  User: Cherie Dark A Date/time: 12/02/2017 10:42 AM  Comment: Called pt and lmsg for him to CB to get sch for echo.Vassie Moment  Context: [place] Schedule Orders/Appt Requests Outcome: Left Message  Phone number: [phone] Phone Type: Home Phone  Comm. type: Telephone Call type: Outgoing  Contact: Willene Hatchet Relation to patient: Self  Letter:      User: Cherie Dark A Date/time: 11/29/2017 8:36 AM  Comment: Called pt and lmsg for him to CB to sch echo.Vassie Moment  Context: [place] Schedule Orders/Appt Requests Outcome: Left Message  Phone number: [phone] Phone Type: Home Phone  Comm. type: Telephone Call type: Outgoing  Contact: Willene Hatchet Relation to patient: Self  Letter:

## 2017-12-17 ENCOUNTER — Telehealth: Payer: Self-pay

## 2017-12-17 NOTE — Telephone Encounter (Signed)
Called and left message to call back to schedule echo.

## 2017-12-17 NOTE — Telephone Encounter (Signed)
-----   Message from Verdene Rio sent at 12/13/2017  1:57 PM EST ----- Regarding: Echo wq order  Katheren Puller,  This patient has been contact multiple times to get his scheduled for an echo Dr. Radford Pax wanted this month. Due to not having received a response he will be removed from the workqueue. Have a great day!  Rollene Fare

## 2017-12-26 NOTE — Telephone Encounter (Signed)
Left message for patient to schedule echo. Informed patient to call back if he would like to schedule appt.

## 2018-03-28 DIAGNOSIS — Z23 Encounter for immunization: Secondary | ICD-10-CM | POA: Diagnosis not present

## 2018-04-03 DIAGNOSIS — M62838 Other muscle spasm: Secondary | ICD-10-CM | POA: Diagnosis not present

## 2018-04-03 DIAGNOSIS — M542 Cervicalgia: Secondary | ICD-10-CM | POA: Diagnosis not present

## 2018-04-03 DIAGNOSIS — M9901 Segmental and somatic dysfunction of cervical region: Secondary | ICD-10-CM | POA: Diagnosis not present

## 2018-04-03 DIAGNOSIS — M9902 Segmental and somatic dysfunction of thoracic region: Secondary | ICD-10-CM | POA: Diagnosis not present

## 2018-05-13 DIAGNOSIS — Z136 Encounter for screening for cardiovascular disorders: Secondary | ICD-10-CM | POA: Diagnosis not present

## 2018-05-13 DIAGNOSIS — Z0000 Encounter for general adult medical examination without abnormal findings: Secondary | ICD-10-CM | POA: Diagnosis not present

## 2018-05-13 DIAGNOSIS — F5101 Primary insomnia: Secondary | ICD-10-CM | POA: Diagnosis not present

## 2018-05-13 DIAGNOSIS — Z20821 Contact with and (suspected) exposure to Zika virus: Secondary | ICD-10-CM | POA: Diagnosis not present

## 2018-09-29 DIAGNOSIS — Z23 Encounter for immunization: Secondary | ICD-10-CM | POA: Diagnosis not present

## 2018-10-15 IMAGING — DX DG ANKLE COMPLETE 3+V*R*
4 series · 4 of 4 positions shown · non-contrast
Comparison: No recent prior.

CLINICAL DATA: Injury.

EXAM:
RIGHT ANKLE - COMPLETE 3+ VIEW

[ankle ap]
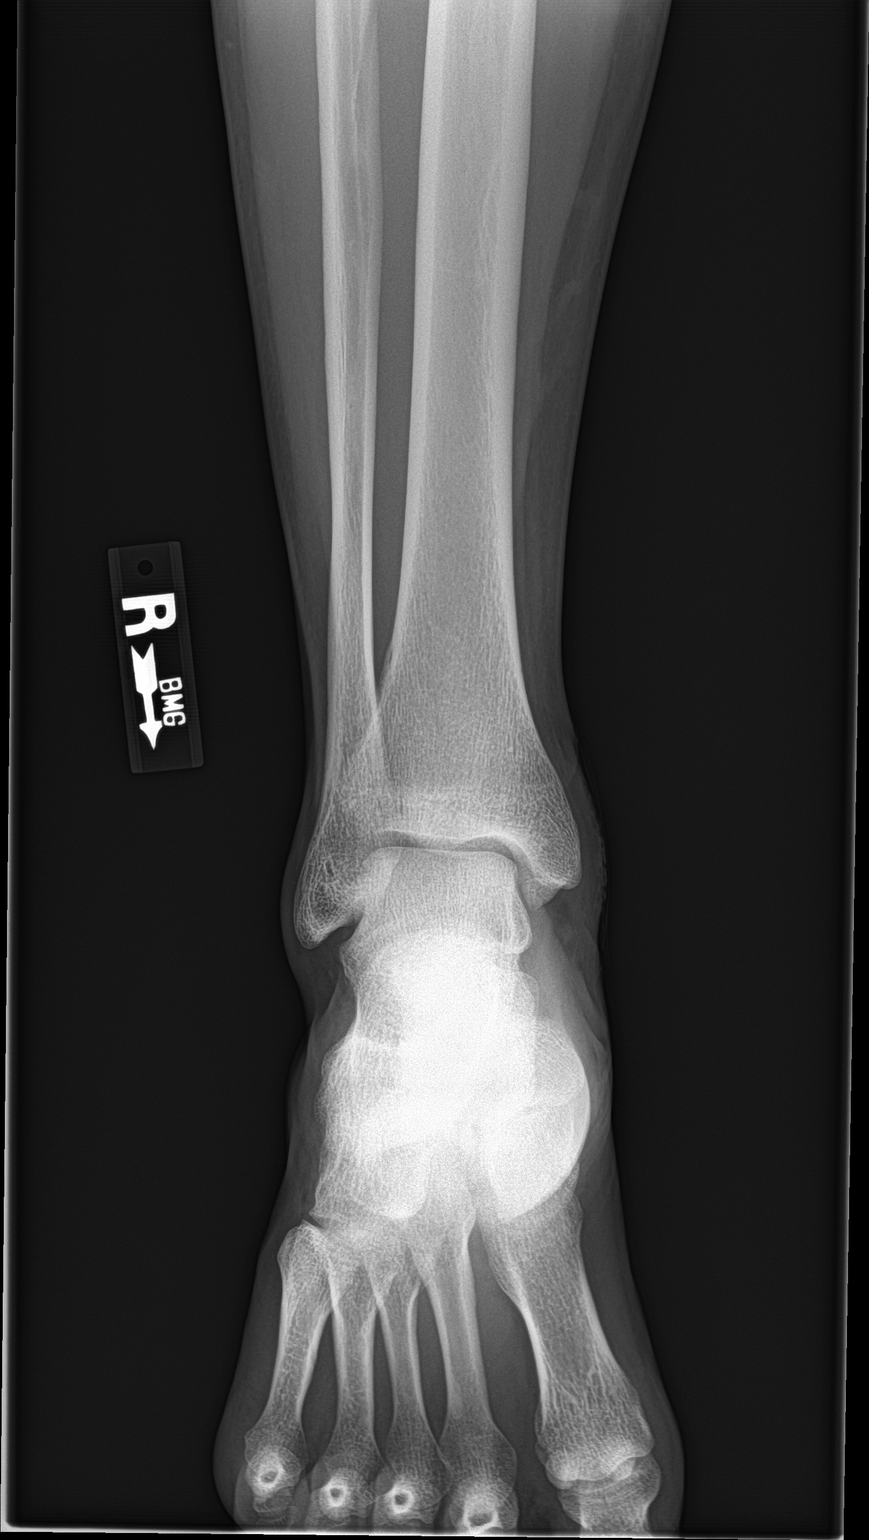

[ankle obl (1 of 2)]
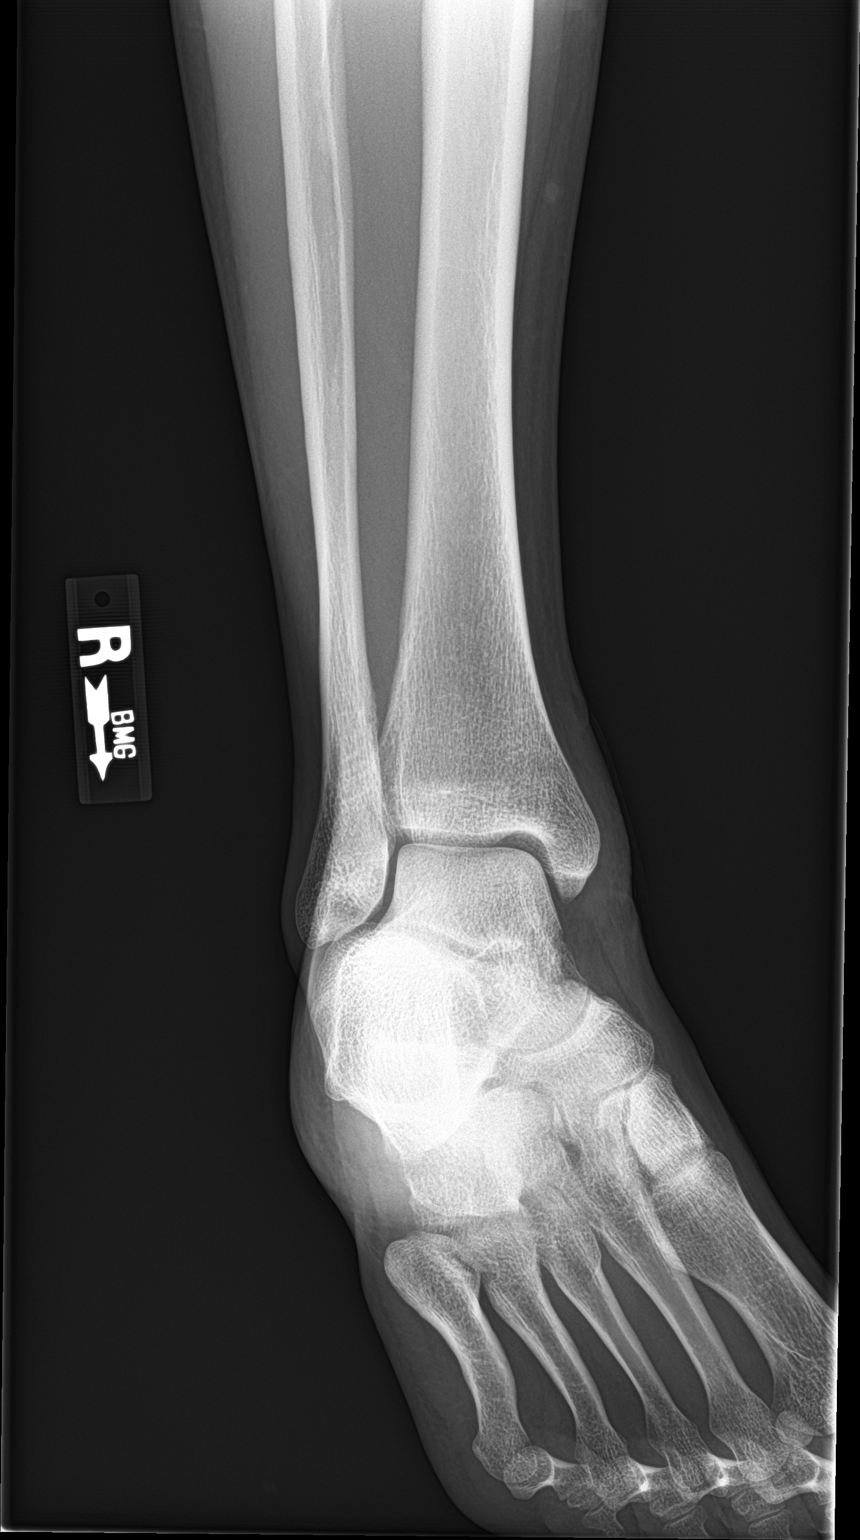

[ankle lat]
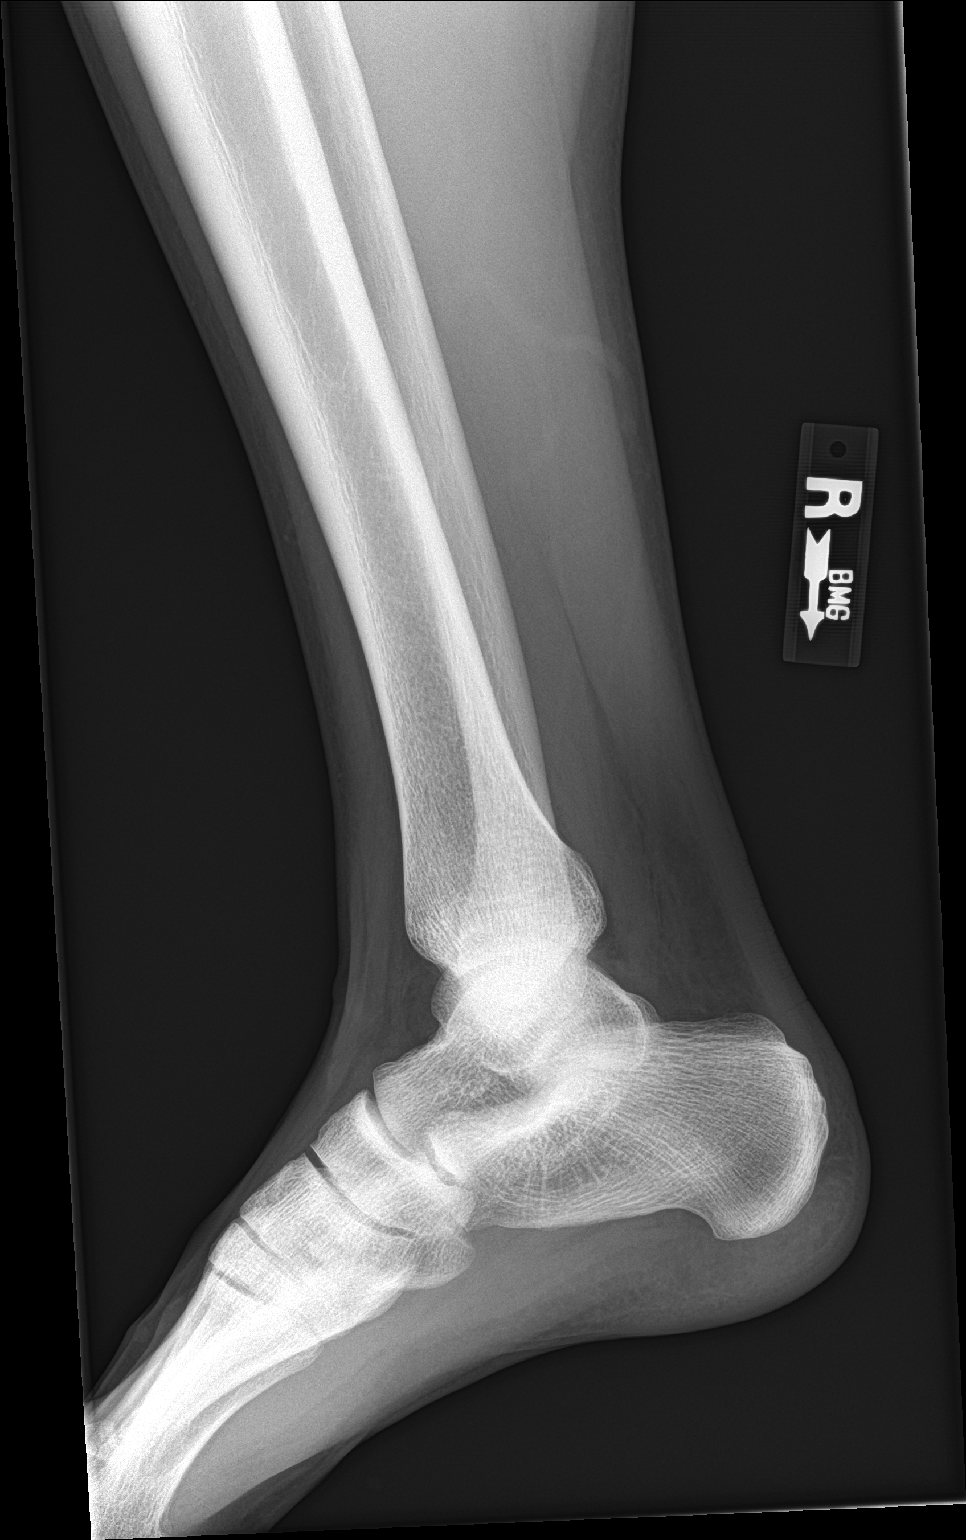

[ankle obl (2 of 2)]
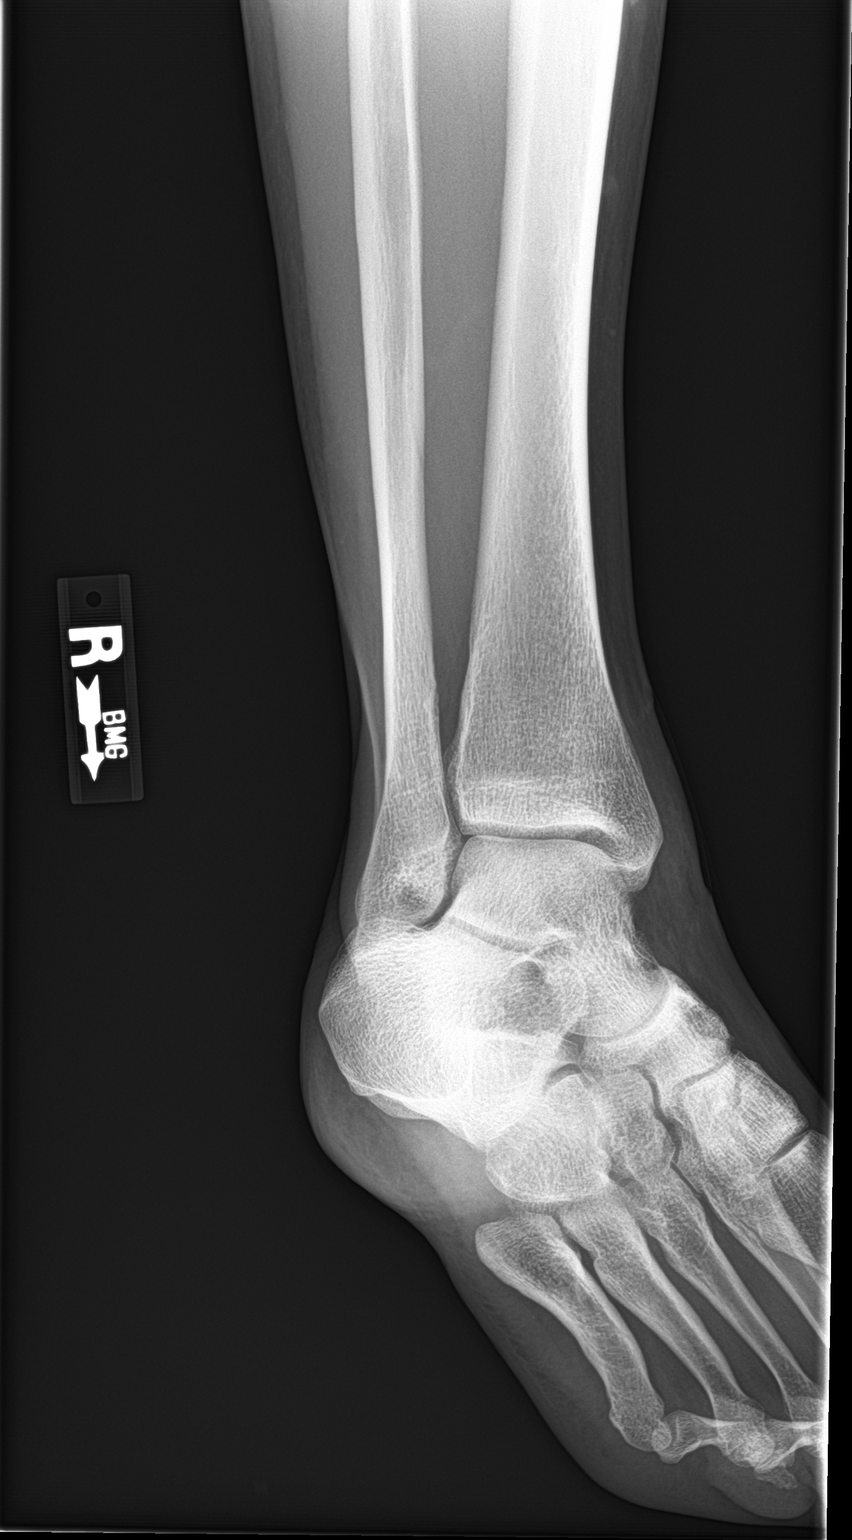

[4 of 4 positions shown; findings below may reference images not displayed]

FINDINGS: Diffuse soft tissue swelling noted. No evidence of fracture or
dislocation.
IMPRESSION: Diffuse mild soft tissue swelling.  No acute abnormality.

## 2019-05-20 DIAGNOSIS — H52221 Regular astigmatism, right eye: Secondary | ICD-10-CM | POA: Diagnosis not present

## 2020-02-18 MED FILL — MAGIC MOUTHWASH D/M/L: 2 days supply | Qty: 120 | Fill #0

## 2020-02-18 MED FILL — NYSTATIN 100,000 UNITS/ML S: 100000 | 7 days supply | Qty: 112 | Fill #0

## 2020-11-03 ENCOUNTER — Ambulatory Visit: Payer: 59

## 2021-11-16 ENCOUNTER — Telehealth: Payer: No Typology Code available for payment source | Admitting: Physician Assistant

## 2021-11-16 DIAGNOSIS — J069 Acute upper respiratory infection, unspecified: Secondary | ICD-10-CM | POA: Diagnosis not present

## 2021-11-16 MED ORDER — FLUTICASONE PROPIONATE 50 MCG/ACT NA SUSP: 2.0000 | Freq: Every day | NASAL | 0 refills | Status: DC

## 2021-11-16 MED ORDER — DOXYCYCLINE HYCLATE 100 MG PO TABS: 100.0000 mg | ORAL_TABLET | Freq: Two times a day (BID) | ORAL | 0 refills | Status: DC

## 2021-11-16 NOTE — Patient Instructions (Signed)
James Kelly, thank you for joining [name], PA-C for today's virtual visit.  While this provider is not your primary care provider (PCP), if your PCP is located in our provider database this encounter information will be shared with them immediately following your visit.  Consent: (Patient) James Kelly provided verbal consent for this virtual visit at the beginning of the encounter.  Current Medications:  Current Outpatient Medications:    lidocaine (LINDAMANTLE) 3 % CREA cream, Apply 1 application topically as needed. 60 minutes before wound care, Disp: 85 g, Rfl: 0   Lidocaine HCl 2 % SOLN, Soak gauze with viscous lidocaine and leave on wound for 60 minutes prior to wound care., Disp: 100 mL, Rfl: 0   mupirocin ointment (BACTROBAN) 2 %, Apply 1 application topically 2 (two) times daily., Disp: 30 g, Rfl: 1   sulfamethoxazole-trimethoprim (BACTRIM DS,SEPTRA DS) 800-160 MG tablet, Take 2 tablets by mouth 2 (two) times daily., Disp: 28 tablet, Rfl: 0   zolpidem (AMBIEN CR) 6.25 MG CR tablet, Take 6.25 mg by mouth at bedtime as needed. , Disp: , Rfl:    Medications ordered in this encounter:  No orders of the defined types were placed in this encounter.    *If you need refills on other medications prior to your next appointment, please contact your pharmacy*  Follow-Up: Call back or seek an in-person evaluation if the symptoms worsen or if the condition fails to improve as anticipated.  Other Instructions  Again, it is not a bad idea to take your own home COVID test just to be cautious.   As long as negative, Increase fluid intake.  Use Saline nasal spray.  Take a daily multivitamin. Continue your OTC medications. Start the [facility] as directed.  Place a humidifier in the bedroom.    If symptoms are not improving over next 72 hours or any continual worsening of symptoms, ok to take the antibiotic as directed.   Sinusitis Sinusitis is redness, soreness, and swelling  (inflammation) of the paranasal sinuses. Paranasal sinuses are air pockets within the bones of your face (beneath the eyes, the middle of the forehead, or above the eyes). In healthy paranasal sinuses, mucus is able to drain out, and air is able to circulate through them by way of your nose. However, when your paranasal sinuses are inflamed, mucus and air can become trapped. This can allow bacteria and other germs to grow and cause infection. Sinusitis can develop quickly and last only a short time (acute) or continue over a long period (chronic). Sinusitis that lasts for more than 12 weeks is considered chronic.  CAUSES  Causes of sinusitis include: Allergies. Structural abnormalities, such as displacement of the cartilage that separates your nostrils (deviated septum), which can decrease the air flow through your nose and sinuses and affect sinus drainage. Functional abnormalities, such as when the small hairs (cilia) that line your sinuses and help remove mucus do not work properly or are not present. SYMPTOMS  Symptoms of acute and chronic sinusitis are the same. The primary symptoms are pain and pressure around the affected sinuses. Other symptoms include: Upper toothache. Earache. Headache. Bad breath. Decreased sense of smell and taste. A cough, which worsens when you are lying flat. Fatigue. Fever. Thick drainage from your nose, which often is green and may contain pus (purulent). Swelling and warmth over the affected sinuses. DIAGNOSIS  Your caregiver will perform a physical exam. During the exam, your caregiver may: Look in your nose for  signs of abnormal growths in your nostrils (nasal polyps). Tap over the affected sinus to check for signs of infection. View the inside of your sinuses (endoscopy) with a special imaging device with a light attached (endoscope), which is inserted into your sinuses. If your caregiver suspects that you have chronic sinusitis, one or more of the  following tests may be recommended: Allergy tests. Nasal culture A sample of mucus is taken from your nose and sent to a lab and screened for bacteria. Nasal cytology A sample of mucus is taken from your nose and examined by your caregiver to determine if your sinusitis is related to an allergy. TREATMENT  Most cases of acute sinusitis are related to a viral infection and will resolve on their own within 10 days. Sometimes medicines are prescribed to help relieve symptoms (pain medicine, decongestants, nasal steroid sprays, or saline sprays).  However, for sinusitis related to a bacterial infection, your caregiver will prescribe antibiotic medicines. These are medicines that will help kill the bacteria causing the infection.  Rarely, sinusitis is caused by a fungal infection. In theses cases, your caregiver will prescribe antifungal medicine. For some cases of chronic sinusitis, surgery is needed. Generally, these are cases in which sinusitis recurs more than 3 times per year, despite other treatments. HOME CARE INSTRUCTIONS  Drink plenty of water. Water helps thin the mucus so your sinuses can drain more easily. Use a humidifier. Inhale steam 3 to 4 times a day (for example, sit in the bathroom with the shower running). Apply a warm, moist washcloth to your face 3 to 4 times a day, or as directed by your caregiver. Use saline nasal sprays to help moisten and clean your sinuses. Take over-the-counter or prescription medicines for pain, discomfort, or fever only as directed by your caregiver. SEEK IMMEDIATE MEDICAL CARE IF: You have increasing pain or severe headaches. You have nausea, vomiting, or drowsiness. You have swelling around your face. You have vision problems. You have a stiff neck. You have difficulty breathing. MAKE SURE YOU:  Understand these instructions. Will watch your condition. Will get help right away if you are not doing well or get worse. Document Released: 11/12/2005  Document Revised: 02/04/2012 Document Reviewed: 11/27/2011 [facility] Patient Information 2014 [place], [place].    If you have been instructed to have an in-person evaluation today at a local Urgent Care facility, please use the link below. It will take you to a list of all of our available [facility], including address, phone number and hours of operation. Please do not delay care.  [facility]  If you or a family member do not have a primary care provider, use the link below to schedule a visit and establish care. When you choose a [facility] primary care physician or advanced practice provider, you gain a long-term partner in health. Find a Primary Care Provider  Learn more about [facility]'s in-office and virtual care options: [facility]

## 2021-11-16 NOTE — Progress Notes (Signed)
Virtual Visit Consent   James Kelly, you are scheduled for a virtual visit with a [facility] provider today.     Just as with appointments in the office, your consent must be obtained to participate.  Your consent will be active for this visit and any virtual visit you may have with one of our providers in the next 365 days.     If you have a MyChart account, a copy of this consent can be sent to you electronically.  All virtual visits are billed to your insurance company just like a traditional visit in the office.    As this is a virtual visit, video technology does not allow for your provider to perform a traditional examination.  This may limit your provider's ability to fully assess your condition.  If your provider identifies any concerns that need to be evaluated in person or the need to arrange testing (such as labs, EKG, etc.), we will make arrangements to do so.     Although advances in technology are sophisticated, we cannot ensure that it will always work on either your end or our end.  If the connection with a video visit is poor, the visit may have to be switched to a telephone visit.  With either a video or telephone visit, we are not always able to ensure that we have a secure connection.     I need to obtain your verbal consent now.   Are you willing to proceed with your visit today?    James Kelly has provided verbal consent on 11/16/2021 for a virtual visit (video or telephone).   James Kelly, [place]   Date: 11/16/2021 5:34 PM   Virtual Visit via Video Note   I, James Kelly, connected with  James Kelly  ([id number], 09-13-1986) on 11/16/21 at  5:30 PM EST by a video-enabled telemedicine application and verified that I am speaking with the correct person using two identifiers.  Location: Patient: Virtual Visit Location Patient: Home Provider: Virtual Visit Location Provider: Home Office   I discussed the limitations of evaluation and management  by telemedicine and the availability of in person appointments. The patient expressed understanding and agreed to proceed.    History of Present Illness: James Kelly is a 35 y.o. who identifies as a male who was assigned male at birth, and is being seen today for possible sinusitis. Patient endorses several days of URI symptoms including chest and nasal congestion. Denies fever, chills or aches. Notes ear pressure and popping, worse in R ear. Notes chest congestion and cough that is now productive of yellow phlegm. Mother-in-law sick recently but had a sinus infection. Has been taking OTC Mucinex, Tylenol, etc for symptoms.   HPI: HPI  Problems:  Patient Active Problem List   Diagnosis Date Noted   LVH (left ventricular hypertrophy) 08/22/2016   Incomplete right bundle branch block (RBBB) 08/22/2016    Allergies: No Known Allergies Medications:  Current Outpatient Medications:    doxycycline (VIBRA-TABS) 100 MG tablet, Take 1 tablet (100 mg total) by mouth 2 (two) times daily., Disp: 14 tablet, Rfl: 0   fluticasone (FLONASE) 50 MCG/ACT nasal spray, Place 2 sprays into both nostrils daily., Disp: 16 g, Rfl: 0  Observations/Objective: Patient is well-developed, well-nourished in no acute distress.  Resting comfortably at home.  Head is normocephalic, atraumatic.  No labored breathing. Speech is clear and coherent with logical content.  Patient is alert and oriented at baseline.  Assessment and Plan: 1. Upper respiratory tract infection, unspecified type  Discussed mos likely viral giving multiple sick family members. Does not seem consistent with flu. Other members have tested negative for COVID. Discussed not unreasonable for him to take one as well just to be cautious. Supportive measures, OTC medications and vitamin recommendations reviewed. Start Flonase per orders. Will put Rx for Doxycycline on file to take if symptoms continue to progress over next few days and any sinus pain  worsens.   Follow Up Instructions: I discussed the assessment and treatment plan with the patient. The patient was provided an opportunity to ask questions and all were answered. The patient agreed with the plan and demonstrated an understanding of the instructions.  A copy of instructions were sent to the patient via MyChart unless otherwise noted below.   The patient was advised to call back or seek an in-person evaluation if the symptoms worsen or if the condition fails to improve as anticipated.  Time:  I spent 10 minutes with the patient via telehealth technology discussing the above problems/concerns.    James Rio, PA-C

## 2022-10-04 ENCOUNTER — Encounter (HOSPITAL_BASED_OUTPATIENT_CLINIC_OR_DEPARTMENT_OTHER): Payer: Self-pay

## 2022-10-04 ENCOUNTER — Ambulatory Visit (HOSPITAL_BASED_OUTPATIENT_CLINIC_OR_DEPARTMENT_OTHER): Payer: Managed Care, Other (non HMO) | Admitting: Family Medicine

## 2022-11-22 ENCOUNTER — Ambulatory Visit (HOSPITAL_BASED_OUTPATIENT_CLINIC_OR_DEPARTMENT_OTHER): Payer: No Typology Code available for payment source | Admitting: Family Medicine

## 2022-11-22 ENCOUNTER — Encounter (HOSPITAL_BASED_OUTPATIENT_CLINIC_OR_DEPARTMENT_OTHER): Payer: Self-pay

## 2023-06-06 ENCOUNTER — Ambulatory Visit: Payer: Managed Care, Other (non HMO) | Admitting: Family Medicine

## 2023-06-07 ENCOUNTER — Ambulatory Visit: Payer: Managed Care, Other (non HMO) | Admitting: Family Medicine

## 2023-08-02 ENCOUNTER — Ambulatory Visit (INDEPENDENT_AMBULATORY_CARE_PROVIDER_SITE_OTHER): Payer: Managed Care, Other (non HMO) | Admitting: Family Medicine

## 2023-08-02 ENCOUNTER — Encounter: Payer: Self-pay | Admitting: Family Medicine

## 2023-08-02 ENCOUNTER — Telehealth: Payer: Self-pay | Admitting: Family Medicine

## 2023-08-02 VITALS — BP 122/74 | HR 56 | Temp 98.3°F | Ht 75.5 in | Wt 209.0 lb

## 2023-08-02 DIAGNOSIS — E7800 Pure hypercholesterolemia, unspecified: Secondary | ICD-10-CM

## 2023-08-02 DIAGNOSIS — F5101 Primary insomnia: Secondary | ICD-10-CM | POA: Diagnosis not present

## 2023-08-02 DIAGNOSIS — F4329 Adjustment disorder with other symptoms: Secondary | ICD-10-CM | POA: Diagnosis not present

## 2023-08-02 DIAGNOSIS — Z23 Encounter for immunization: Secondary | ICD-10-CM

## 2023-08-02 DIAGNOSIS — Z7689 Persons encountering health services in other specified circumstances: Secondary | ICD-10-CM

## 2023-08-02 NOTE — Progress Notes (Signed)
New Patient Office Visit  Subjective    Patient ID: James Kelly, male    DOB: 08/23/1986  Age: 37 y.o. MRN: [id number]  CC:  Chief Complaint  Patient presents with   Establish Care    HPI James Kelly presents to establish care   Insomnia- takes Unisom nightly  States he has tried other sleep medications but Unisom works best with fewest side effects.   Hx of HLD.  LVH in chart with normal echo in 2017  He has lost weight. Exercises regularly.  Eating healthy   Married  Has 2 kids  Recently under a lot of stress with illnesses in family and with his dog.  As a result of stress, his libido has been diminished.  Denies depression.     Outpatient Encounter Medications as of 08/02/2023  Medication Sig   doxylamine, Sleep, (WAL-SOM) 25 MG tablet Take by mouth.   [DISCONTINUED] doxycycline (VIBRA-TABS) 100 MG tablet Take 1 tablet (100 mg total) by mouth 2 (two) times daily.   [DISCONTINUED] fluticasone (FLONASE) 50 MCG/ACT nasal spray Place 2 sprays into both nostrils daily.   No facility-administered encounter medications on file as of 08/02/2023.    Past Medical History:  Diagnosis Date   Incomplete right bundle branch block (RBBB) 08/22/2016   Insomnia    HX OF   LVH (left ventricular hypertrophy) 08/22/2016    Past Surgical History:  Procedure Laterality Date   HERNIA REPAIR     TONSILLECTOMY      Family History  Problem Relation Age of Onset   Hypertension Mother    Heart disease Father    Evelene Croon Parkinson White syndrome Father    Hypertension Father    Cancer Father    Thyroid disease Maternal Grandmother    Cancer Maternal Grandmother    Heart disease Paternal Grandmother    Hyperlipidemia Paternal Grandmother    Cancer Maternal Grandfather    Heart disease Paternal Grandfather    Hypertension Paternal Grandfather     Social History   Socioeconomic History   Marital status: Single    Spouse name: Not on file   Number of children: Not  on file   Years of education: Not on file   Highest education level: Not on file  Occupational History   Not on file  Tobacco Use   Smoking status: Never   Smokeless tobacco: Never  Substance and Sexual Activity   Alcohol use: Yes    Alcohol/week: 1.0 standard drink of alcohol    Types: 1 Glasses of wine per week   Drug use: No   Sexual activity: Yes  Other Topics Concern   Not on file  Social History Narrative   Not on file   Social Determinants of Health   Financial Resource Strain: Not on file  Food Insecurity: No Food Insecurity (10/11/2022)   [facility]   Hunger Vital Sign    Worried About Running Out of Food in the Last Year: Never true    Ran Out of Food in the Last Year: Never true  Transportation Needs: Not on file  Physical Activity: Not on file  Stress: Not on file  Social Connections: Unknown (10/08/2022)   [facility] from [facility]   Social Network    Social Network: Not on file  Intimate Partner Violence: Unknown (10/08/2022)   Received from [facility]   HITS    Physically Hurt: Not on file    Insult or Talk Down To: Not  on file    Threaten Physical Harm: Not on file    Scream or Curse: Not on file    Review of Systems  Constitutional:  Negative for chills and fever.  Respiratory:  Negative for shortness of breath.   Cardiovascular:  Negative for chest pain, palpitations and leg swelling.  Gastrointestinal:  Negative for abdominal pain, constipation, diarrhea, nausea and vomiting.  Genitourinary:  Negative for dysuria, frequency and urgency.  Neurological:  Negative for dizziness and focal weakness.  Psychiatric/Behavioral:  Negative for depression. The patient has insomnia.        Increased stress        Objective    BP 122/74 (BP Location: Right Arm, Patient Position: Sitting, Cuff Size: Normal)   Pulse (!) 56   Temp 98.3 F (36.8 C) (Oral)   Ht 6' 3.5" (1.918 m)   Wt 209 lb (94.8 kg)   SpO2 99%   BMI 25.78 kg/m    Physical Exam Constitutional:      General: He is not in acute distress.    Appearance: He is not ill-appearing.  Eyes:     Extraocular Movements: Extraocular movements intact.     Conjunctiva/sclera: Conjunctivae normal.  Cardiovascular:     Rate and Rhythm: Normal rate.  Pulmonary:     Effort: Pulmonary effort is normal.  Musculoskeletal:     Cervical back: Normal range of motion.     Right lower leg: No edema.     Left lower leg: No edema.  Skin:    General: Skin is warm and dry.  Neurological:     General: No focal deficit present.     Mental Status: He is alert and oriented to person, place, and time.  Psychiatric:        Mood and Affect: Mood normal.        Behavior: Behavior normal.        Thought Content: Thought content normal.         Assessment & Plan:   Problem List Items Addressed This Visit       Other   Insomnia   Pure hypercholesterolemia - Primary   Stress and adjustment reaction   Other Visit Diagnoses     Encounter to establish care       Need for influenza vaccination       Relevant Orders   Flu vaccine trivalent PF, 6mos and older(Flulaval,Afluria,Fluarix,Fluzone) (Completed)      He is a pleasant 37 year old male who is new to the practice and here to establish care. Insomnia-Unisom seems to be working well.  Discussed good sleep hygiene. HLD-continue healthy diet and exercise.  Will check fasting lipids at his follow-up visit Stress- recommend scheduling with a therapist.  Hold off on medication for now.  Libido most likely due to recent stressors.  He may reach out to try Viagra. Follow-up for fasting CPE.  Return for fasting CPE at their convenience.   Hetty Blend, NP-C

## 2023-08-02 NOTE — Telephone Encounter (Signed)
error 

## 2023-08-02 NOTE — Patient Instructions (Signed)
 [email or web address]   [email or web address]   [facility] Multiple locations [phone]     [facility] [address] [place], [place] [address]  Phone: [phone]   [facility]  [address], [place], [place] [address]  Phone: [phone]

## 2023-08-22 ENCOUNTER — Other Ambulatory Visit: Payer: Self-pay

## 2023-08-22 ENCOUNTER — Other Ambulatory Visit (HOSPITAL_COMMUNITY): Payer: Self-pay

## 2023-08-22 MED ORDER — FLUTICASONE PROPIONATE 50 MCG/ACT NA SUSP
1.0000 | Freq: Two times a day (BID) | NASAL | 1 refills | Status: DC
  Filled 2023-08-22: qty 16, 50d supply, fill #0

## 2023-08-22 MED ORDER — AZELASTINE HCL 0.1 % NA SOLN
NASAL | 1 refills | Status: DC
  Filled 2023-08-22: qty 30, 30d supply, fill #0

## 2023-08-22 MED ORDER — PROMETHAZINE-DM 6.25-15 MG/5ML PO SYRP
5.0000 mL | ORAL_SOLUTION | Freq: Four times a day (QID) | ORAL | 0 refills | Status: DC | PRN
  Filled 2023-08-22: qty 200, 10d supply, fill #0

## 2023-08-22 MED ORDER — BENZONATATE 200 MG PO CAPS
200.0000 mg | ORAL_CAPSULE | Freq: Three times a day (TID) | ORAL | 0 refills | Status: DC | PRN
  Filled 2023-08-22: qty 30, 10d supply, fill #0

## 2023-08-22 MED ORDER — PSEUDOEPHEDRINE-GUAIFENESIN ER 120-1200 MG PO TB12: 1.0000 | ORAL_TABLET | Freq: Two times a day (BID) | ORAL | 0 refills | Status: DC | PRN

## 2023-09-02 ENCOUNTER — Encounter: Payer: Self-pay | Admitting: Family Medicine

## 2023-09-04 ENCOUNTER — Encounter: Payer: Managed Care, Other (non HMO) | Admitting: Family Medicine

## 2023-09-06 ENCOUNTER — Ambulatory Visit: Payer: Managed Care, Other (non HMO) | Admitting: Family Medicine

## 2023-09-06 VITALS — BP 102/70 | HR 56 | Temp 97.6°F | Ht 75.5 in | Wt 207.0 lb

## 2023-09-06 DIAGNOSIS — Z1159 Encounter for screening for other viral diseases: Secondary | ICD-10-CM

## 2023-09-06 DIAGNOSIS — Z13220 Encounter for screening for lipoid disorders: Secondary | ICD-10-CM | POA: Diagnosis not present

## 2023-09-06 DIAGNOSIS — Z0000 Encounter for general adult medical examination without abnormal findings: Secondary | ICD-10-CM

## 2023-09-06 DIAGNOSIS — Z8349 Family history of other endocrine, nutritional and metabolic diseases: Secondary | ICD-10-CM

## 2023-09-06 DIAGNOSIS — Z0001 Encounter for general adult medical examination with abnormal findings: Secondary | ICD-10-CM | POA: Insufficient documentation

## 2023-09-06 DIAGNOSIS — R6882 Decreased libido: Secondary | ICD-10-CM | POA: Diagnosis not present

## 2023-09-06 HISTORY — DX: Encounter for general adult medical examination with abnormal findings: Z00.01

## 2023-09-06 LAB — URINALYSIS, ROUTINE W REFLEX MICROSCOPIC
Bilirubin Urine: NEGATIVE
Hgb urine dipstick: NEGATIVE
Ketones, ur: NEGATIVE
Leukocytes,Ua: NEGATIVE
Nitrite: NEGATIVE
RBC / HPF: NONE SEEN (ref 0–?)
Specific Gravity, Urine: <=1.005 — AB (ref 1.000–1.030)
Total Protein, Urine: NEGATIVE
Urine Glucose: NEGATIVE
Urobilinogen, UA: 0.2 (ref 0.0–1.0)
pH: 6.5 (ref 5.0–8.0)

## 2023-09-06 LAB — CBC WITH DIFFERENTIAL/PLATELET
Basophils Absolute: 0 K/uL (ref 0.0–0.1)
Basophils Relative: 0.7 % (ref 0.0–3.0)
Eosinophils Absolute: 0 K/uL (ref 0.0–0.7)
Eosinophils Relative: 2 % (ref 0.0–5.0)
HCT: 40.7 % (ref 39.0–52.0)
Hemoglobin: 13.6 g/dL (ref 13.0–17.0)
Lymphocytes Relative: 47.1 % — ABNORMAL HIGH (ref 12.0–46.0)
Lymphs Abs: 1 K/uL (ref 0.7–4.0)
MCHC: 33.4 g/dL (ref 30.0–36.0)
MCV: 91.7 fl (ref 78.0–100.0)
Monocytes Absolute: 0.2 K/uL (ref 0.1–1.0)
Monocytes Relative: 8.6 % (ref 3.0–12.0)
Neutro Abs: 0.9 K/uL — ABNORMAL LOW (ref 1.4–7.7)
Neutrophils Relative %: 41.6 % — ABNORMAL LOW (ref 43.0–77.0)
Platelets: 168 K/uL (ref 150.0–400.0)
RBC: 4.44 Mil/uL (ref 4.22–5.81)
RDW: 12.6 % (ref 11.5–15.5)
WBC: 2.2 K/uL — ABNORMAL LOW (ref 4.0–10.5)

## 2023-09-06 LAB — COMPREHENSIVE METABOLIC PANEL
ALT: 31 U/L (ref 0–53)
AST: 30 U/L (ref 0–37)
Albumin: 4.4 g/dL (ref 3.5–5.2)
Alkaline Phosphatase: 61 U/L (ref 39–117)
BUN: 21 mg/dL (ref 6–23)
CO2: 30 meq/L (ref 19–32)
Calcium: 10.1 mg/dL (ref 8.4–10.5)
Chloride: 104 meq/L (ref 96–112)
Creatinine, Ser: 0.93 mg/dL (ref 0.40–1.50)
GFR: 104.63 mL/min (ref >60.00)
Glucose, Bld: 108 mg/dL — ABNORMAL HIGH (ref 70–99)
Potassium: 4.9 meq/L (ref 3.5–5.1)
Sodium: 141 meq/L (ref 135–145)
Total Bilirubin: 0.9 mg/dL (ref 0.2–1.2)
Total Protein: 6.3 g/dL (ref 6.0–8.3)

## 2023-09-06 LAB — LIPID PANEL
Cholesterol: 229 mg/dL — ABNORMAL HIGH (ref 0–200)
HDL: 49.8 mg/dL (ref >39.00)
LDL Cholesterol: 155 mg/dL — ABNORMAL HIGH (ref 0–99)
NonHDL: 178.86
Total CHOL/HDL Ratio: 5
Triglycerides: 118 mg/dL (ref 0.0–149.0)
VLDL: 23.6 mg/dL (ref 0.0–40.0)

## 2023-09-06 LAB — HEMOGLOBIN A1C: Hgb A1c MFr Bld: 5.5 % (ref 4.6–6.5)

## 2023-09-06 LAB — TESTOSTERONE: Testosterone: 368.83 ng/dL (ref 300.00–890.00)

## 2023-09-06 LAB — TSH: TSH: 2.52 u[IU]/mL (ref 0.35–5.50)

## 2023-09-06 LAB — T4, FREE: Free T4: 0.77 ng/dL (ref 0.60–1.60)

## 2023-09-06 NOTE — Progress Notes (Signed)
Please call him and let him know that white blood cell count is low. I do not have others to compare to. Has he ever been told this before? I would like for him to come back in to see me next week and recheck this. We will talk about his cholesterol then as well.

## 2023-09-06 NOTE — Progress Notes (Signed)
Complete physical exam  Patient: James Kelly   DOB: Mar 15, 1986   37 y.o. Male  MRN: [id number]  Subjective:    Chief Complaint  Patient presents with   Annual Exam    fasting   He is here for a complete physical exam.  Father has pancreatic cancer      Health Maintenance  Topic Date Due   HIV Screening  Never done   Hepatitis C Screening  Never done   COVID-19 Vaccine (5 - 2023-24 season) 10/10/2023   DTaP/Tdap/Td vaccine (2 - Td or Tdap) 07/21/2027   Flu Shot  Completed   HPV Vaccine  Aged Out    Wears seatbelt always, uses sunscreen, smoke detectors in home and functioning, does not text while driving, feels safe in home environment.  Depression screening:    09/06/2023    8:05 AM 08/02/2023    3:18 PM 07/22/2017   10:36 AM  Depression screen PHQ 2/9  Decreased Interest 0 0 0  Down, Depressed, Hopeless 0 0 0  PHQ - 2 Score 0 0 0   Anxiety Screening:     No data to display          Vision:Not within last year  and Dental: No current dental problems and Receives regular dental care  Patient Active Problem List   Diagnosis Date Noted   Encounter for general adult medical examination with abnormal findings 09/06/2023   Decreased libido 09/06/2023   Family history of thyroid disease in mother 09/06/2023   Stress and adjustment reaction 08/02/2023   Pure hypercholesterolemia 08/02/2023   ETD (Eustachian tube dysfunction), bilateral 04/16/2017   Rhinitis, chronic 04/16/2017   LVH (left ventricular hypertrophy) 08/22/2016   Incomplete right bundle branch block (RBBB) 08/22/2016   Insomnia 06/26/2010   Past Medical History:  Diagnosis Date   Encounter for general adult medical examination with abnormal findings 09/06/2023   Incomplete right bundle branch block (RBBB) 08/22/2016   Insomnia    HX OF   LVH (left ventricular hypertrophy) 08/22/2016   Past Surgical History:  Procedure Laterality Date   HERNIA REPAIR     TONSILLECTOMY     Social  History   Tobacco Use   Smoking status: Never   Smokeless tobacco: Never  Substance Use Topics   Alcohol use: Not Currently    Alcohol/week: 1.0 standard drink of alcohol    Types: 1 Glasses of wine per week    Comment: rarely   Drug use: No      Patient Care Team: Avanell Shackleton, NP-C as PCP - General (Family Medicine)   Outpatient Medications Prior to Visit  Medication Sig   doxylamine, Sleep, (WAL-SOM) 25 MG tablet Take by mouth.   [DISCONTINUED] azelastine (ASTELIN) 0.1 % nasal spray Instill one spray by Both Nostrils  2 (two) times daily. Use in each nostril as directed   [DISCONTINUED] benzonatate (TESSALON) 200 MG capsule Take 1 capsule (200 mg total) by mouth 3 (three) times daily as needed.   [DISCONTINUED] fluticasone (FLONASE) 50 MCG/ACT nasal spray Place 1 spray into both nostrils 2 (two) times daily.   [DISCONTINUED] promethazine-dextromethorphan (PROMETHAZINE-DM) 6.25-15 MG/5ML syrup Take 5 mLs by mouth 4 (four) times daily as needed as needed for up to 7 days.   [DISCONTINUED] Pseudoephedrine-Guaifenesin (984) [id number] MG TB12 Take 1 tablet by mouth every 12 (twelve) hours as needed.   No facility-administered medications prior to visit.    Review of Systems  Constitutional:  Negative for chills, fever and malaise/fatigue.  HENT:  Negative for congestion, ear pain, sinus pain and sore throat.   Eyes:  Negative for blurred vision, double vision and pain.  Respiratory:  Negative for cough, shortness of breath and wheezing.   Cardiovascular:  Negative for chest pain, palpitations and leg swelling.  Gastrointestinal:  Negative for abdominal pain, constipation, diarrhea, nausea and vomiting.  Genitourinary:  Negative for dysuria, frequency and urgency.  Musculoskeletal:  Negative for back pain, joint pain and myalgias.  Skin:  Negative for rash.  Neurological:  Negative for dizziness, tingling, focal weakness, weakness and headaches.  Endo/Heme/Allergies:         Decreased libido  Psychiatric/Behavioral:  Negative for depression. The patient is not nervous/anxious.        Objective:    BP 102/70 (BP Location: Left Arm, Patient Position: Sitting, Cuff Size: Large)   Pulse (!) 56   Temp 97.6 F (36.4 C) (Temporal)   Ht 6' 3.5" (1.918 m)   Wt 207 lb (93.9 kg)   SpO2 98%   BMI 25.53 kg/m  BP Readings from Last 3 Encounters:  09/06/23 102/70  08/02/23 122/74  07/22/17 118/74   Wt Readings from Last 3 Encounters:  09/06/23 207 lb (93.9 kg)  08/02/23 209 lb (94.8 kg)  07/22/17 209 lb 12.8 oz (95.2 kg)    Physical Exam Constitutional:      General: He is not in acute distress.    Appearance: He is not ill-appearing.  HENT:     Right Ear: Tympanic membrane, ear canal and external ear normal.     Left Ear: Tympanic membrane, ear canal and external ear normal.     Nose: Nose normal.     Mouth/Throat:     Mouth: Mucous membranes are moist.     Pharynx: Oropharynx is clear.  Eyes:     Extraocular Movements: Extraocular movements intact.     Conjunctiva/sclera: Conjunctivae normal.     Pupils: Pupils are equal, round, and reactive to light.  Neck:     Thyroid: No thyroid mass, thyromegaly or thyroid tenderness.  Cardiovascular:     Rate and Rhythm: Normal rate and regular rhythm.     Pulses: Normal pulses.     Heart sounds: Normal heart sounds.  Pulmonary:     Effort: Pulmonary effort is normal.     Breath sounds: Normal breath sounds.  Abdominal:     General: Bowel sounds are normal. There is no distension.     Palpations: Abdomen is soft.     Tenderness: There is no abdominal tenderness. There is no right CVA tenderness, left CVA tenderness, guarding or rebound.  Musculoskeletal:        General: Normal range of motion.     Cervical back: Normal range of motion and neck supple. No tenderness.     Right lower leg: No edema.     Left lower leg: No edema.  Lymphadenopathy:     Cervical: No cervical adenopathy.  Skin:    General:  Skin is warm and dry.     Findings: No lesion or rash.  Neurological:     General: No focal deficit present.     Mental Status: He is alert and oriented to person, place, and time.     Cranial Nerves: No cranial nerve deficit.     Sensory: No sensory deficit.     Motor: No weakness.  Psychiatric:        Mood and Affect: Mood normal.        Behavior:  Behavior normal.        Thought Content: Thought content normal.      No results found for any visits on 09/06/23.    Assessment & Plan:    Routine Health Maintenance and Physical Exam  Problem List Items Addressed This Visit       Other   Decreased libido   Relevant Orders   CBC with Differential/Platelet   Comprehensive metabolic panel   Hemoglobin A1c   TSH   T4, free   Urinalysis, Routine w reflex microscopic   Testosterone   Encounter for general adult medical examination with abnormal findings - Primary   Family history of thyroid disease in mother   Relevant Orders   TSH   T4, free   Other Visit Diagnoses     Encounter for screening for other viral diseases       Relevant Orders   Hepatitis C antibody   Screening for lipid disorders       Relevant Orders   Lipid panel      Preventive health care reviewed.  Counseling on healthy lifestyle including diet and exercise.  Recommend regular dental and eye exams.  Immunizations reviewed.  Discussed safety. Check labs to look for underlying etiology for decreased libido. Follow up pending results.   Return in about 1 year (around 09/05/2024).     Hetty Blend, NP-C

## 2023-09-07 LAB — HEPATITIS C ANTIBODY: Hepatitis C Ab: NONREACTIVE

## 2023-09-13 ENCOUNTER — Ambulatory Visit (INDEPENDENT_AMBULATORY_CARE_PROVIDER_SITE_OTHER): Payer: Managed Care, Other (non HMO) | Admitting: Family Medicine

## 2023-09-13 ENCOUNTER — Encounter: Payer: Self-pay | Admitting: Family Medicine

## 2023-09-13 VITALS — BP 122/64 | HR 70 | Temp 97.6°F | Ht 75.5 in | Wt 206.0 lb

## 2023-09-13 DIAGNOSIS — F5101 Primary insomnia: Secondary | ICD-10-CM

## 2023-09-13 DIAGNOSIS — E7800 Pure hypercholesterolemia, unspecified: Secondary | ICD-10-CM

## 2023-09-13 DIAGNOSIS — D709 Neutropenia, unspecified: Secondary | ICD-10-CM | POA: Diagnosis not present

## 2023-09-13 NOTE — Progress Notes (Signed)
Subjective:     Patient ID: James Kelly, male    DOB: 03/28/1986, 37 y.o.   MRN: [id number]  Chief Complaint  Patient presents with   Follow-up    HPI  History of Present Illness         Here for follow up on abnormal labs.   WBC 2.2 with neutrophil count 0.9 States his WBC has been in the 3 range for the last decade except last year it was normal.  2023 labs show WBC was 4.1 and neutrophils 2.4  Denies any recurrent infections.   LDL elevated with normal HDL and trigs.  Reports not having a lot of fiber in his diet and room for improvement in regards to fatty foods.   PGF with CAD and CABG scheduled in his 36s but he passed away from an MI No other known CAD in family hx.   Insomnia- states his children are not yet sleeping well so this impacts his sleep schedule. Unisom helps.  States he tried Ambien in the past and he was not able to function well on it.    Health Maintenance Due  Topic Date Due   HIV Screening  Never done    Past Medical History:  Diagnosis Date   Encounter for general adult medical examination with abnormal findings 09/06/2023   Incomplete right bundle branch block (RBBB) 08/22/2016   Insomnia    HX OF   LVH (left ventricular hypertrophy) 08/22/2016    Past Surgical History:  Procedure Laterality Date   HERNIA REPAIR     TONSILLECTOMY      Family History  Problem Relation Age of Onset   Thyroid disease Mother    Hypertension Mother    Heart disease Father    Evelene Croon Parkinson White syndrome Father    Hypertension Father    Cancer Father    Thyroid disease Maternal Grandmother    Cancer Maternal Grandmother    Cancer Maternal Grandfather    Heart disease Paternal Grandmother    Hyperlipidemia Paternal Grandmother    Heart disease Paternal Grandfather    Hypertension Paternal Grandfather     Social History   Socioeconomic History   Marital status: Single    Spouse name: Not on file   Number of children: Not on file    Years of education: Not on file   Highest education level: Doctorate  Occupational History   Not on file  Tobacco Use   Smoking status: Never   Smokeless tobacco: Never  Substance and Sexual Activity   Alcohol use: Not Currently    Alcohol/week: 1.0 standard drink of alcohol    Types: 1 Glasses of wine per week    Comment: rarely   Drug use: No   Sexual activity: Yes  Other Topics Concern   Not on file  Social History Narrative   Not on file   Social Determinants of Health   Financial Resource Strain: Low Risk  (09/06/2023)   Overall Financial Resource Strain (CARDIA)    Difficulty of Paying Living Expenses: Not very hard  Food Insecurity: No Food Insecurity (09/06/2023)   Hunger Vital Sign    Worried About Running Out of Food in the Last Year: Never true    Ran Out of Food in the Last Year: Never true  Transportation Needs: No Transportation Needs (09/06/2023)   PRAPARE - Administrator, Civil Service (Medical): No    Lack of Transportation (Non-Medical): No  Physical Activity: Sufficiently Active (09/06/2023)  Exercise Vital Sign    Days of Exercise per Week: 6 days    Minutes of Exercise per Session: 150+ min  Stress: Stress Concern Present (09/06/2023)   [employer] of Occupational Health - Occupational Stress Questionnaire    Feeling of Stress : To some extent  Social Connections: Moderately Integrated (09/06/2023)   Social Connection and Isolation Panel [NHANES]    Frequency of Communication with Friends and Family: More than three times a week    Frequency of Social Gatherings with Friends and Family: Once a week    Attends Religious Services: 1 to 4 times per year    Active Member of [employer] or Organizations: No    Attends Engineer, structural: Not on file    Marital Status: Married  Intimate Partner Violence: Unknown (10/08/2022)   Received from [facility]   HITS    Physically Hurt: Not on file    Insult or Talk Down To: Not on  file    Threaten Physical Harm: Not on file    Scream or Curse: Not on file    Outpatient Medications Prior to Visit  Medication Sig Dispense Refill   doxylamine, Sleep, (WAL-SOM) 25 MG tablet Take by mouth.     No facility-administered medications prior to visit.    No Known Allergies  Review of Systems  Constitutional:  Negative for chills, fever and malaise/fatigue.  Respiratory:  Negative for cough and shortness of breath.   Cardiovascular:  Negative for chest pain, palpitations and leg swelling.  Gastrointestinal:  Negative for abdominal pain, constipation, diarrhea, nausea and vomiting.  Genitourinary:  Negative for dysuria, frequency and urgency.  Neurological:  Negative for dizziness, focal weakness and headaches.  Psychiatric/Behavioral:  The patient has insomnia.        Objective:    Physical Exam Constitutional:      General: He is not in acute distress.    Appearance: He is not ill-appearing.  Eyes:     Extraocular Movements: Extraocular movements intact.     Conjunctiva/sclera: Conjunctivae normal.  Cardiovascular:     Rate and Rhythm: Normal rate.  Pulmonary:     Effort: Pulmonary effort is normal.  Musculoskeletal:     Cervical back: Normal range of motion and neck supple.  Skin:    General: Skin is warm and dry.  Neurological:     General: No focal deficit present.     Mental Status: He is alert and oriented to person, place, and time.  Psychiatric:        Mood and Affect: Mood normal.        Behavior: Behavior normal.        Thought Content: Thought content normal.      BP 122/64 (BP Location: Left Arm, Patient Position: Sitting, Cuff Size: Large)   Pulse 70   Temp 97.6 F (36.4 C) (Temporal)   Ht 6' 3.5" (1.918 m)   Wt 206 lb (93.4 kg)   SpO2 98%   BMI 25.41 kg/m  Wt Readings from Last 3 Encounters:  09/13/23 206 lb (93.4 kg)  09/06/23 207 lb (93.9 kg)  08/02/23 209 lb (94.8 kg)       Assessment & Plan:   Problem List Items  Addressed This Visit       Other   Insomnia   Pure hypercholesterolemia   Other Visit Diagnoses     Neutropenia, unspecified type (HCC)    -  Primary   Relevant Orders   Ambulatory referral to Hematology /  Oncology      He is here to follow-up on WBC 2.2 with neutropenia.  These labs were significantly higher last year (in Care Everywhere). He report having a history of low white blood cell count in the 3 range for several years.  Denies any recurrent infections.  This is never been worked up by hematology.  Referral to hematology ordered today. We discussed elevated LDL and working on healthy diet, including decreasing saturated fat and increasing fiber in his diet.  His ASCVD 10-year risk is 1.3%.  Family history of CAD in his paternal grandfather in his 29s.    I am having Merton Border. Kath maintain his [employer].  No orders of the defined types were placed in this encounter.

## 2023-09-13 NOTE — Patient Instructions (Signed)
You will hear from the hematology office at [facility].

## 2023-09-18 ENCOUNTER — Telehealth: Payer: Self-pay | Admitting: Hematology and Oncology

## 2023-09-18 NOTE — Telephone Encounter (Signed)
Called and left VM for patient to call back to schedule an appointment.

## 2023-09-21 ENCOUNTER — Other Ambulatory Visit: Payer: Self-pay | Admitting: Medical Genetics

## 2023-09-21 DIAGNOSIS — Z006 Encounter for examination for normal comparison and control in clinical research program: Secondary | ICD-10-CM

## 2023-11-11 ENCOUNTER — Encounter: Payer: Self-pay | Admitting: Family Medicine

## 2023-11-12 ENCOUNTER — Other Ambulatory Visit: Payer: Self-pay | Admitting: Family Medicine

## 2023-12-05 ENCOUNTER — Telehealth (INDEPENDENT_AMBULATORY_CARE_PROVIDER_SITE_OTHER): Payer: Managed Care, Other (non HMO) | Admitting: Family Medicine

## 2023-12-05 DIAGNOSIS — D709 Neutropenia, unspecified: Secondary | ICD-10-CM

## 2023-12-05 DIAGNOSIS — E7800 Pure hypercholesterolemia, unspecified: Secondary | ICD-10-CM | POA: Diagnosis not present

## 2023-12-05 MED ORDER — METFORMIN HCL ER 500 MG PO TB24: 500.0000 mg | ORAL_TABLET | Freq: Every day | ORAL | 1 refills | Status: DC

## 2023-12-05 NOTE — Progress Notes (Signed)
 MyChart Video Visit    Virtual Visit via Video Note    Patient location: Home. Patient and provider in visit Provider location: Office  I discussed the limitations of evaluation and management by telemedicine and the availability of in person appointments. The patient expressed understanding and agreed to proceed.  Visit Date: 12/05/2023  Today's healthcare provider: Boby Mackintosh, NP-C     Subjective:    Patient ID: James Kelly, male    DOB: 1986-03-09, 38 y.o.   MRN: [id number]  No chief complaint on file.   HPI  [name].   LDL- 155  Father with pancreatic cancer.   WBC was 2.2 in October 2024. States this is not new, has been in the 2-3 range in the past. He has not yet scheduled with hematology for evaluation but plans on doing so.      Past Medical History:  Diagnosis Date   Encounter for general adult medical examination with abnormal findings 09/06/2023   Incomplete right bundle branch block (RBBB) 08/22/2016   Insomnia    HX OF   LVH (left ventricular hypertrophy) 08/22/2016    Past Surgical History:  Procedure Laterality Date   HERNIA REPAIR     TONSILLECTOMY      Family History  Problem Relation Age of Onset   Thyroid disease Mother    Hypertension Mother    Heart disease Father    Kassie Parkinson White syndrome Father    Hypertension Father    Cancer Father    Thyroid disease Maternal Grandmother    Cancer Maternal Grandmother    Cancer Maternal Grandfather    Heart disease Paternal Grandmother    Hyperlipidemia Paternal Grandmother    Heart disease Paternal Grandfather    Hypertension Paternal Grandfather     Social History   Socioeconomic History   Marital status: Single    Spouse name: Not on file   Number of children: Not on file   Years of education: Not on file   Highest education level: Doctorate  Occupational History   Not on file  Tobacco Use    Smoking status: Never   Smokeless tobacco: Never  Substance and Sexual Activity   Alcohol use: Not Currently    Alcohol/week: 1.0 standard drink of alcohol    Types: 1 Glasses of wine per week    Comment: rarely   Drug use: No   Sexual activity: Yes  Other Topics Concern   Not on file  Social History Narrative   Not on file   Social Drivers of Health   Financial Resource Strain: Low Risk  (09/06/2023)   Overall Financial Resource Strain (CARDIA)    Difficulty of Paying Living Expenses: Not very hard  Food Insecurity: No Food Insecurity (09/06/2023)   Hunger Vital Sign    Worried About Running Out of Food in the Last Year: Never true    Ran Out of Food in the Last Year: Never true  Transportation Needs: No Transportation Needs (09/06/2023)   PRAPARE - Administrator, Civil Service (Medical): No    Lack of Transportation (Non-Medical): No  Physical Activity: Sufficiently Active (09/06/2023)   Exercise Vital Sign    Days of Exercise per Week: 6 days    Minutes of Exercise per Session: 150+ min  Stress: Stress Concern Present (09/06/2023)   [employer] of Occupational Health - Occupational Stress Questionnaire    Feeling of Stress :  To some extent  Social Connections: Moderately Integrated (09/06/2023)   Social Connection and Isolation Panel [NHANES]    Frequency of Communication with Friends and Family: More than three times a week    Frequency of Social Gatherings with Friends and Family: Once a week    Attends Religious Services: 1 to 4 times per year    Active Member of [employer] or Organizations: No    Attends Engineer, Structural: Not on file    Marital Status: Married  Intimate Partner Violence: Unknown (10/08/2022)   Received from [facility]   HITS    Physically Hurt: Not on file    Insult or Talk Down To: Not on file    Threaten Physical Harm: Not on file    Scream or Curse: Not on file    Outpatient Medications Prior to Visit   Medication Sig Dispense Refill   doxylamine, Sleep, (WAL-SOM) 25 MG tablet Take by mouth.     No facility-administered medications prior to visit.    No Known Allergies  ROS Denies fever, chills, dizziness, chest pain, palpitations, shortness of breath, abdominal pain     Objective:    Physical Exam  There were no vitals taken for this visit. Wt Readings from Last 3 Encounters:  09/13/23 206 lb (93.4 kg)  09/06/23 207 lb (93.9 kg)  08/02/23 209 lb (94.8 kg)   Alert and in no distress. Respirations unlabored. Normal speech and mood.      Assessment & Plan:   Problem List Items Addressed This Visit     Neutropenia (HCC)   Pure hypercholesterolemia - Primary   Reviewed labs. Discussed personal and family health histories. Discussed benefits and potential side effects of metformin . He will try metformin  500 mg XR with breakfast. Follow up in 3 months and recheck fasting labs. In the meantime, he will schedule with hematology for evaluation.   I am having James Kelly. Rolston start on metFORMIN . I am also having him maintain his [employer].  Meds ordered this encounter  Medications   metFORMIN  (GLUCOPHAGE -XR) 500 MG 24 hr tablet    Sig: Take 1 tablet (500 mg total) by mouth daily with breakfast.    Dispense:  90 tablet    Refill:  1    Supervising Provider:   ROLLENE NORRIS A [[id number]]    I discussed the assessment and treatment plan with the patient. The patient was provided an opportunity to ask questions and all were answered. The patient agreed with the plan and demonstrated an understanding of the instructions.   The patient was advised to call back or seek an in-person evaluation if the symptoms worsen or if the condition fails to improve as anticipated.     Boby Mackintosh, NP-C [facility] [place] [facility] [phone] (phone) [phone] (fax)  [facility]

## 2023-12-08 ENCOUNTER — Encounter: Payer: Self-pay | Admitting: Family Medicine

## 2023-12-08 DIAGNOSIS — D709 Neutropenia, unspecified: Secondary | ICD-10-CM | POA: Insufficient documentation

## 2023-12-27 ENCOUNTER — Encounter: Payer: Self-pay | Admitting: Family Medicine

## 2024-01-01 ENCOUNTER — Encounter: Payer: Self-pay | Admitting: Family Medicine

## 2024-01-01 ENCOUNTER — Ambulatory Visit (INDEPENDENT_AMBULATORY_CARE_PROVIDER_SITE_OTHER): Payer: Managed Care, Other (non HMO)

## 2024-01-01 ENCOUNTER — Ambulatory Visit: Payer: Self-pay | Admitting: Family Medicine

## 2024-01-01 ENCOUNTER — Ambulatory Visit (INDEPENDENT_AMBULATORY_CARE_PROVIDER_SITE_OTHER): Payer: Managed Care, Other (non HMO) | Admitting: Family Medicine

## 2024-01-01 VITALS — BP 110/70 | HR 73 | Temp 98.4°F | Ht 75.5 in | Wt 196.0 lb

## 2024-01-01 DIAGNOSIS — J189 Pneumonia, unspecified organism: Secondary | ICD-10-CM | POA: Diagnosis not present

## 2024-01-01 DIAGNOSIS — B9689 Other specified bacterial agents as the cause of diseases classified elsewhere: Secondary | ICD-10-CM

## 2024-01-01 DIAGNOSIS — J101 Influenza due to other identified influenza virus with other respiratory manifestations: Secondary | ICD-10-CM

## 2024-01-01 DIAGNOSIS — R0602 Shortness of breath: Secondary | ICD-10-CM

## 2024-01-01 DIAGNOSIS — R051 Acute cough: Secondary | ICD-10-CM

## 2024-01-01 LAB — POCT INFLUENZA A/B
Influenza A, POC: NEGATIVE
Influenza B, POC: NEGATIVE

## 2024-01-01 LAB — POC COVID19 BINAXNOW: SARS Coronavirus 2 Ag: NEGATIVE

## 2024-01-01 LAB — POCT RESPIRATORY SYNCYTIAL VIRUS: RSV Rapid Ag: NEGATIVE

## 2024-01-01 MED ORDER — AZITHROMYCIN 250 MG PO TABS: ORAL_TABLET | ORAL | 0 refills | Status: AC

## 2024-01-01 MED ORDER — PREDNISONE 10 MG (21) PO TBPK: ORAL_TABLET | Freq: Every day | ORAL | 0 refills | Status: AC

## 2024-01-01 MED ORDER — HYDROCODONE BIT-HOMATROP MBR 5-1.5 MG/5ML PO SOLN: 5.0000 mL | Freq: Three times a day (TID) | ORAL | 0 refills | Status: DC | PRN

## 2024-01-01 MED ORDER — METHYLPREDNISOLONE ACETATE 80 MG/ML IJ SUSP
80.0000 mg | Freq: Once | INTRAMUSCULAR | Status: AC
  Administered 2024-01-01: 80 mg via INTRAMUSCULAR

## 2024-01-01 MED ORDER — AMOXICILLIN-POT CLAVULANATE 875-125 MG PO TABS: 1.0000 | ORAL_TABLET | Freq: Two times a day (BID) | ORAL | 0 refills | Status: DC

## 2024-01-01 NOTE — Progress Notes (Signed)
 Acute Office Visit  Subjective:     Patient ID: James Kelly, male    DOB: 1985/12/27, 38 y.o.   MRN: [id number]  Chief Complaint  Patient presents with   Acute Visit    Day 7 of feeling bad, did an at home test for Flu A, came back positive. 104 temp, cough fits, sides hurt, sore throat    HPI Patient is in today for evaluation of fever, flu exposure. Had positive flu test A at home yesterday. Has been sick for the last 7 days, states that he was getting better for 3 to 4 days, and then began to feel much worse yesterday with temperature up to 104, fatigue, shortness of breath, significantly worsening cough, chills, body aches, fatigue. Was not treated with Tamiflu at the beginning of the illness, we are past the window at this point. Has been able to drink fluids, does not have much of an appetite Reports cough is dry, chest and ribs are sore from coughing Has been taking OTC cough and cold with no relief. Reports that his youngest child is also recently had the flu. Denies other concerns today   ROS Per HPI      Objective:    BP 110/70 (BP Location: Left Arm, Patient Position: Sitting)   Pulse 73   Temp 98.4 F (36.9 C) (Oral)   Ht 6' 3.5 (1.918 m)   Wt 196 lb (88.9 kg)   SpO2 96%   BMI 24.17 kg/m    Physical Exam Vitals and nursing note reviewed.  Constitutional:      General: He is not in acute distress.    Appearance: He is ill-appearing.  HENT:     Head: Normocephalic and atraumatic.     Nose: Congestion present.     Mouth/Throat:     Mouth: Mucous membranes are moist.     Pharynx: Oropharynx is clear.  Cardiovascular:     Rate and Rhythm: Tachycardia present.  Pulmonary:     Effort: No respiratory distress.     Breath sounds: Wheezing present. No rhonchi or rales.     Comments: Dry cough, mildly labored breathing. Able to speak in sentences Musculoskeletal:        General: Normal range of motion.     Cervical back: Normal range of motion.   Lymphadenopathy:     Cervical: Cervical adenopathy present.  Skin:    General: Skin is warm and dry.  Neurological:     Mental Status: He is alert.     No results found for any visits on 01/01/24.      Assessment & Plan:  1. Community acquired pneumonia of right middle lobe of lung (Primary)  - azithromycin  (ZITHROMAX ) 250 MG tablet; Take 2 tablets on day 1, then 1 tablet daily on days 2 through 5  Dispense: 6 tablet; Refill: 0 - DG Chest 2 View; Future - POC COVID-19 BinaxNow - POCT Influenza A/B - POCT respiratory syncytial virus - amoxicillin -clavulanate (AUGMENTIN ) 875-125 MG tablet; Take 1 tablet by mouth 2 (two) times daily.  Dispense: 20 tablet; Refill: 0 - superimposed secondary PNA  2. SOB (shortness of breath)  - predniSONE  (STERAPRED UNI-PAK 21 TAB) 10 MG (21) TBPK tablet; Take by mouth daily for 6 days. Take 6 tablets on day 1, 5 tablets on day 2, 4 tablets on day 3, 3 tablets on day 4, 2 tablets on day 5, 1 tablet on day 6  Dispense: 21 tablet; Refill: 0 - methylPREDNISolone  acetate (DEPO-MEDROL )  injection 80 mg - DG Chest 2 View; Future - POC COVID-19 BinaxNow - POCT Influenza A/B - POCT respiratory syncytial virus  3. Acute cough  - HYDROcodone  bit-homatropine (HYCODAN) 5-1.5 MG/5ML syrup; Take 5 mLs by mouth every 8 (eight) hours as needed for cough.  Dispense: 120 mL; Refill: 0 - DG Chest 2 View; Future - POC COVID-19 BinaxNow - POCT Influenza A/B - POCT respiratory syncytial virus  4. Influenza A  - DG Chest 2 View; Future - POC COVID-19 BinaxNow - POCT Influenza A/B - Viral testing negative in the office today    Meds ordered this encounter  Medications   HYDROcodone  bit-homatropine (HYCODAN) 5-1.5 MG/5ML syrup    Sig: Take 5 mLs by mouth every 8 (eight) hours as needed for cough.    Dispense:  120 mL    Refill:  0   predniSONE  (STERAPRED UNI-PAK 21 TAB) 10 MG (21) TBPK tablet    Sig: Take by mouth daily for 6 days. Take 6 tablets on day  1, 5 tablets on day 2, 4 tablets on day 3, 3 tablets on day 4, 2 tablets on day 5, 1 tablet on day 6    Dispense:  21 tablet    Refill:  0   methylPREDNISolone  acetate (DEPO-MEDROL ) injection 80 mg   azithromycin  (ZITHROMAX ) 250 MG tablet    Sig: Take 2 tablets on day 1, then 1 tablet daily on days 2 through 5    Dispense:  6 tablet    Refill:  0   amoxicillin -clavulanate (AUGMENTIN ) 875-125 MG tablet    Sig: Take 1 tablet by mouth 2 (two) times daily.    Dispense:  20 tablet    Refill:  0    Return if symptoms worsen or fail to improve.  Corean Ku, FNP

## 2024-01-01 NOTE — Patient Instructions (Addendum)
 We are getting an xray today. We will be in contact with any abnormal results that require further attention.  I have sent in azithromycin  for you to take.  Take 2 tablets today, then 1 tablet daily for the next 4 days.  I have sent in hydrocodone  cough syrup for you to take 5 mL once daily in the evening as needed for cough.  This medication may make you sleepy.  Do not drive or operate heavy machinery while taking this medication.  You have received a steroid injection in the office today.  I have sent in a prednisone  taper for you to start tomorrow. Take by mouth daily for 6 days. Take 6 tablets on day 1, 5 tablets on day 2, 4 tablets on day 3, 3 tablets on day 4, 2 tablets on day 5, 1 tablet on day 6 .  If you are not improving by Friday, please f/u with me and let me know

## 2024-01-01 NOTE — Telephone Encounter (Signed)
 Copied From CRM [id number]. Reason for Triage: Spouse called in stating her husband is experiencing fever, cough, nausea, lethargic & proceeds to say she may think he has pneumonia.   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 After scheduling appointment, she requested to speak directly with the NP he is scheduled with & then stated she will send a message to her herself.

## 2024-03-11 ENCOUNTER — Telehealth: Admitting: Physician Assistant

## 2024-03-11 DIAGNOSIS — J019 Acute sinusitis, unspecified: Secondary | ICD-10-CM | POA: Diagnosis not present

## 2024-03-11 MED ORDER — PROMETHAZINE-DM 6.25-15 MG/5ML PO SYRP: 5.0000 mL | ORAL_SOLUTION | Freq: Four times a day (QID) | ORAL | 0 refills | Status: DC | PRN

## 2024-03-11 MED ORDER — AMOXICILLIN-POT CLAVULANATE 875-125 MG PO TABS: 1.0000 | ORAL_TABLET | Freq: Two times a day (BID) | ORAL | 0 refills | Status: DC

## 2024-03-11 NOTE — Patient Instructions (Signed)
  James Kelly, thank you for joining Char Common Ward, PA-C for today's virtual visit.  While this provider is not your primary care provider (PCP), if your PCP is located in our provider database this encounter information will be shared with them immediately following your visit.   A [facility] MyChart account gives you access to today's visit and all your visits, tests, and labs performed at [facility] " click here if you don't have a [facility] MyChart account or go to [email or web address]  Consent: (Patient) James Kelly provided verbal consent for this virtual visit at the beginning of the encounter.  Current Medications:  Current Outpatient Medications:    amoxicillin-clavulanate (AUGMENTIN) 875-125 MG tablet, Take 1 tablet by mouth 2 (two) times daily., Disp: 20 tablet, Rfl: 0   promethazine-dextromethorphan (PROMETHAZINE-DM) 6.25-15 MG/5ML syrup, Take 5 mLs by mouth 4 (four) times daily as needed for cough., Disp: 118 mL, Rfl: 0   doxylamine, Sleep, (WAL-SOM) 25 MG tablet, Take by mouth., Disp: , Rfl:    metFORMIN (GLUCOPHAGE-XR) 500 MG 24 hr tablet, Take 1 tablet (500 mg total) by mouth daily with breakfast., Disp: 90 tablet, Rfl: 1   Medications ordered in this encounter:  Meds ordered this encounter  Medications   amoxicillin-clavulanate (AUGMENTIN) 875-125 MG tablet    Sig: Take 1 tablet by mouth 2 (two) times daily.    Dispense:  20 tablet    Refill:  0    Supervising Provider:   HAGLER, BRIAN [[id number]]   promethazine-dextromethorphan (PROMETHAZINE-DM) 6.25-15 MG/5ML syrup    Sig: Take 5 mLs by mouth 4 (four) times daily as needed for cough.    Dispense:  118 mL    Refill:  0    Supervising Provider:   Afton Albright [[id number]]     *If you need refills on other medications prior to your next appointment, please contact your pharmacy*  Follow-Up: Call back or seek an in-person evaluation if the symptoms worsen or if the condition fails to improve as  anticipated.  [facility] [phone]  Other Instructions Take antibiotic as prescribed.  Recommend continued supportive care including Mucinex.  Drink plenty of fluids.    If you have been instructed to have an in-person evaluation today at a local Urgent Care facility, please use the link below. It will take you to a list of all of our available [facility], including address, phone number and hours of operation. Please do not delay care.  [facility]  If you or a family member do not have a primary care provider, use the link below to schedule a visit and establish care. When you choose a [facility] primary care physician or advanced practice provider, you gain a long-term partner in health. Find a Primary Care Provider  Learn more about [facility]'s in-office and virtual care options: [facility] - Get Care Now

## 2024-03-11 NOTE — Progress Notes (Signed)
 Virtual Visit Consent   James Kelly, you are scheduled for a virtual visit with a [facility] provider today. Just as with appointments in the office, your consent must be obtained to participate. Your consent will be active for this visit and any virtual visit you may have with one of our providers in the next 365 days. If you have a MyChart account, a copy of this consent can be sent to you electronically.  As this is a virtual visit, video technology does not allow for your provider to perform a traditional examination. This may limit your provider's ability to fully assess your condition. If your provider identifies any concerns that need to be evaluated in person or the need to arrange testing (such as labs, EKG, etc.), we will make arrangements to do so. Although advances in technology are sophisticated, we cannot ensure that it will always work on either your end or our end. If the connection with a video visit is poor, the visit may have to be switched to a telephone visit. With either a video or telephone visit, we are not always able to ensure that we have a secure connection.  By engaging in this virtual visit, you consent to the provision of healthcare and authorize for your insurance to be billed (if applicable) for the services provided during this visit. Depending on your insurance coverage, you may receive a charge related to this service.  I need to obtain your verbal consent now. Are you willing to proceed with your visit today? James Kelly has provided verbal consent on 03/11/2024 for a virtual visit (video or telephone). Char Common Ward, PA-C  Date: 03/11/2024 5:56 PM   Virtual Visit via Video Note   I, Char Common Ward, connected with  James Kelly  ([id number], 1986-03-22) on 03/11/24 at  5:30 PM EDT by a video-enabled telemedicine application and verified that I am speaking with the correct person using two identifiers.  Location: Patient: Virtual Visit Location Patient:  Home Provider: Virtual Visit Location Provider: Home Office   I discussed the limitations of evaluation and management by telemedicine and the availability of in person appointments. The patient expressed understanding and agreed to proceed.    History of Present Illness: James Kelly is a 38 y.o. who identifies as a male who was assigned male at birth, and is being seen today for a week of sinus pressure, pain, cough.  He reports sx have progressively worsened over the week.  Reports cough which is worse at night.  States low grade fever today.  Denies shortness of breath or wheezing.  Has been taking otc cold and cough medications with minimal relief. Aaron Aas  HPI: HPI  Problems:  Patient Active Problem List   Diagnosis Date Noted   Neutropenia (HCC) 12/08/2023   Encounter for general adult medical examination with abnormal findings 09/06/2023   Decreased libido 09/06/2023   Family history of thyroid disease in mother 09/06/2023   Stress and adjustment reaction 08/02/2023   Pure hypercholesterolemia 08/02/2023   ETD (Eustachian tube dysfunction), bilateral 04/16/2017   Rhinitis, chronic 04/16/2017   LVH (left ventricular hypertrophy) 08/22/2016   Incomplete right bundle branch block (RBBB) 08/22/2016   Insomnia 06/26/2010    Allergies: No Known Allergies Medications:  Current Outpatient Medications:    amoxicillin-clavulanate (AUGMENTIN) 875-125 MG tablet, Take 1 tablet by mouth 2 (two) times daily., Disp: 20 tablet, Rfl: 0   promethazine-dextromethorphan (PROMETHAZINE-DM) 6.25-15 MG/5ML syrup, Take 5 mLs by mouth 4 (four)  times daily as needed for cough., Disp: 118 mL, Rfl: 0   doxylamine, Sleep, (WAL-SOM) 25 MG tablet, Take by mouth., Disp: , Rfl:    metFORMIN (GLUCOPHAGE-XR) 500 MG 24 hr tablet, Take 1 tablet (500 mg total) by mouth daily with breakfast., Disp: 90 tablet, Rfl: 1  Observations/Objective: Patient is well-developed, well-nourished in no acute distress.  Resting  comfortably at home.  Head is normocephalic, atraumatic.  No labored breathing.  Speech is clear and coherent with logical content.  Patient is alert and oriented at baseline.    Assessment and Plan: 1. Acute non-recurrent sinusitis, unspecified location (Primary)  Antibiotic prescribed.  Supportive care discussed.    Follow Up Instructions: I discussed the assessment and treatment plan with the patient. The patient was provided an opportunity to ask questions and all were answered. The patient agreed with the plan and demonstrated an understanding of the instructions.  A copy of instructions were sent to the patient via MyChart unless otherwise noted below.     The patient was advised to call back or seek an in-person evaluation if the symptoms worsen or if the condition fails to improve as anticipated.    Char Common Ward, PA-C

## 2024-03-13 ENCOUNTER — Ambulatory Visit: Payer: Managed Care, Other (non HMO) | Admitting: Family Medicine

## 2024-05-27 ENCOUNTER — Ambulatory Visit (HOSPITAL_BASED_OUTPATIENT_CLINIC_OR_DEPARTMENT_OTHER): Attending: Family Medicine

## 2024-05-27 ENCOUNTER — Ambulatory Visit: Admitting: Family Medicine

## 2024-05-27 ENCOUNTER — Ambulatory Visit: Payer: Self-pay | Admitting: Family Medicine

## 2024-05-27 VITALS — BP 102/66 | HR 55 | Temp 97.8°F | Ht 75.5 in | Wt 198.0 lb

## 2024-05-27 DIAGNOSIS — R1084 Generalized abdominal pain: Secondary | ICD-10-CM

## 2024-05-27 DIAGNOSIS — N200 Calculus of kidney: Secondary | ICD-10-CM | POA: Diagnosis not present

## 2024-05-27 DIAGNOSIS — D709 Neutropenia, unspecified: Secondary | ICD-10-CM

## 2024-05-27 DIAGNOSIS — R300 Dysuria: Secondary | ICD-10-CM | POA: Diagnosis not present

## 2024-05-27 LAB — URINALYSIS, ROUTINE W REFLEX MICROSCOPIC
Bilirubin Urine: NEGATIVE
Hgb urine dipstick: NEGATIVE
Ketones, ur: NEGATIVE
Leukocytes,Ua: NEGATIVE
Nitrite: NEGATIVE
RBC / HPF: NONE SEEN (ref 0–?)
Specific Gravity, Urine: 1.01 (ref 1.000–1.030)
Total Protein, Urine: NEGATIVE
Urine Glucose: NEGATIVE
Urobilinogen, UA: 0.2 (ref 0.0–1.0)
pH: 7 (ref 5.0–8.0)

## 2024-05-27 LAB — CBC WITH DIFFERENTIAL/PLATELET
Basophils Absolute: 0 10*3/uL (ref 0.0–0.1)
Basophils Relative: 0.6 % (ref 0.0–3.0)
Eosinophils Absolute: 0 10*3/uL (ref 0.0–0.7)
Eosinophils Relative: 1.3 % (ref 0.0–5.0)
HCT: 39.9 % (ref 39.0–52.0)
Hemoglobin: 13.8 g/dL (ref 13.0–17.0)
Lymphocytes Relative: 45.3 % (ref 12.0–46.0)
Lymphs Abs: 1.4 10*3/uL (ref 0.7–4.0)
MCHC: 34.5 g/dL (ref 30.0–36.0)
MCV: 88.2 fl (ref 78.0–100.0)
Monocytes Absolute: 0.3 10*3/uL (ref 0.1–1.0)
Monocytes Relative: 8.8 % (ref 3.0–12.0)
Neutro Abs: 1.4 10*3/uL (ref 1.4–7.7)
Neutrophils Relative %: 44 % (ref 43.0–77.0)
Platelets: 163 10*3/uL (ref 150.0–400.0)
RBC: 4.52 Mil/uL (ref 4.22–5.81)
RDW: 12.8 % (ref 11.5–15.5)
WBC: 3.2 10*3/uL — ABNORMAL LOW (ref 4.0–10.5)

## 2024-05-27 LAB — POC URINALSYSI DIPSTICK (AUTOMATED)
Bilirubin, UA: NEGATIVE
Blood, UA: NEGATIVE
Glucose, UA: NEGATIVE
Ketones, UA: NEGATIVE
Leukocytes, UA: NEGATIVE
Nitrite, UA: NEGATIVE
Protein, UA: NEGATIVE
Spec Grav, UA: 1.01 (ref 1.010–1.025)
Urobilinogen, UA: 0.2 U/dL
pH, UA: 7 (ref 5.0–8.0)

## 2024-05-27 NOTE — Progress Notes (Signed)
 Subjective:  James Kelly is a 38 y.o. male who complains of possible urinary tract infection.     Hematuria 1 wk ago, right low back soreness, urinary frequency, dysuria that started today.   No fever, chills, abdominal pain, N/V/D.   More energy since being on Metformin .  No side effects.   He did not see hematology as referred for neutropenia but ok to follow up on this.   Past Medical History:  Diagnosis Date   Encounter for general adult medical examination with abnormal findings 09/06/2023   Incomplete right bundle branch block (RBBB) 08/22/2016   Insomnia    HX OF   LVH (left ventricular hypertrophy) 08/22/2016    ROS as in subjective  Reviewed allergies, medications, past medical, surgical, and social history.    Objective: Vitals:   05/27/24 1410  BP: 102/66  Pulse: (!) 55  Temp: 97.8 F (36.6 C)  SpO2: 98%    General appearance: alert, no distress, WD/WN, male Cardiac: RRR Pulmonary: CTA  Abdomen: +bs, soft, non tender, non distended, no masses, no hepatomegaly, no splenomegaly Back: no CVA tenderness     Laboratory:       Component Ref Range & Units (hover) 14:28 8 mo ago  Color, UA yellow   Clarity, UA clear   Glucose, UA Negative   Bilirubin, UA negative   Ketones, UA negative   Spec Grav, UA 1.010   Blood, UA negative   pH, UA 7.0   Protein, UA Negative   Urobilinogen, UA 0.2 0.2 R  Nitrite, UA negative   Leukocytes, UA Negative   Resulting Agency  [facility]             Assessment: Renal stone - Plan: CBC with Differential/Platelet, Urinalysis, Routine w reflex microscopic, Urinalysis, Routine w reflex microscopic, CT RENAL STONE STUDY, CBC with Differential/Platelet  Dysuria - Plan: POCT Urinalysis Dipstick (Automated), Urine Culture, Urinalysis, Routine w reflex microscopic, Urinalysis, Routine w reflex microscopic, Urine Culture, CT RENAL STONE STUDY  Kidney stone - Plan: Urine Culture, Urine Culture  Neutropenia,  unspecified type (HCC) - Plan: CBC with Differential/Platelet, Urinalysis, Routine w reflex microscopic, Urinalysis, Routine w reflex microscopic, CBC with Differential/Platelet  Generalized abdominal pain - Plan: CT RENAL STONE STUDY   Plan:  He passed a stone while in the office. Urine with stone sent for microscopic urinalysis.  No previous history of renal stones.  No sign of any acute infectious process per point-of-care urinalysis or history and exam.  CT renal stone study ordered.  Rule out hydronephrosis.  Advised increased water intake, can use OTC Tylenol for pain.     Urine culture sent  Call or return if worse or not improving.    Recheck CBC due to history of neutropenia.  New referral to hematology if needed.  Continue metformin  and follow-up for fasting labs at his convenience.

## 2024-05-27 NOTE — Patient Instructions (Signed)
 Be sure to hydrate.   You will receive a call to get the CT renal stone study done.   Let me know if you have any new or worsening symptoms.

## 2024-05-27 NOTE — Addendum Note (Signed)
 Addended by: Giada Schoppe E on: 05/27/2024 03:45 PM   Modules accepted: Orders

## 2024-05-28 ENCOUNTER — Other Ambulatory Visit (HOSPITAL_COMMUNITY): Payer: Self-pay

## 2024-05-28 ENCOUNTER — Other Ambulatory Visit: Payer: Self-pay | Admitting: Family Medicine

## 2024-05-28 LAB — URINE CULTURE: Result:: NO GROWTH

## 2024-06-23 ENCOUNTER — Encounter: Payer: Self-pay | Admitting: Family Medicine

## 2024-09-07 ENCOUNTER — Other Ambulatory Visit: Payer: Self-pay | Admitting: Medical Genetics

## 2024-09-07 DIAGNOSIS — Z006 Encounter for examination for normal comparison and control in clinical research program: Secondary | ICD-10-CM

## 2024-09-10 ENCOUNTER — Encounter: Payer: Self-pay | Admitting: Family Medicine

## 2024-11-28 ENCOUNTER — Other Ambulatory Visit: Payer: Self-pay | Admitting: Family Medicine

## 2024-12-08 ENCOUNTER — Encounter: Payer: Managed Care, Other (non HMO) | Admitting: Family Medicine

## 2024-12-16 ENCOUNTER — Encounter: Admitting: Family Medicine

## 2024-12-17 ENCOUNTER — Ambulatory Visit: Payer: Self-pay | Admitting: Family Medicine

## 2024-12-17 ENCOUNTER — Ambulatory Visit

## 2024-12-17 ENCOUNTER — Ambulatory Visit: Admitting: Family Medicine

## 2024-12-17 VITALS — BP 98/68 | HR 53 | Temp 98.3°F | Ht 75.5 in | Wt 193.6 lb

## 2024-12-17 DIAGNOSIS — R6889 Other general symptoms and signs: Secondary | ICD-10-CM

## 2024-12-17 DIAGNOSIS — R5383 Other fatigue: Secondary | ICD-10-CM | POA: Diagnosis not present

## 2024-12-17 DIAGNOSIS — J101 Influenza due to other identified influenza virus with other respiratory manifestations: Secondary | ICD-10-CM

## 2024-12-17 DIAGNOSIS — R051 Acute cough: Secondary | ICD-10-CM

## 2024-12-17 DIAGNOSIS — J189 Pneumonia, unspecified organism: Secondary | ICD-10-CM | POA: Diagnosis not present

## 2024-12-17 LAB — POCT INFLUENZA A/B
Influenza A, POC: NEGATIVE
Influenza B, POC: POSITIVE — AB

## 2024-12-17 MED ORDER — PREDNISONE 20 MG PO TABS: 40.0000 mg | ORAL_TABLET | Freq: Every day | ORAL | 0 refills | Status: AC

## 2024-12-17 MED ORDER — AMOXICILLIN-POT CLAVULANATE 875-125 MG PO TABS: 1.0000 | ORAL_TABLET | Freq: Two times a day (BID) | ORAL | 0 refills | Status: AC

## 2024-12-17 MED ORDER — HYDROCODONE BIT-HOMATROP MBR 5-1.5 MG/5ML PO SOLN: 5.0000 mL | Freq: Three times a day (TID) | ORAL | 0 refills | Status: AC | PRN

## 2024-12-17 NOTE — Progress Notes (Unsigned)
 "  Acute Office Visit  Subjective:     Patient ID: James Kelly, male    DOB: 12-02-1985, 39 y.o.   MRN: [id number]  Chief Complaint  Patient presents with   Acute Visit    HPI  Discussed the use of AI scribe software for clinical note transcription with the patient, who gave verbal consent to proceed.  History of Present Illness James Kelly is a 39 year old male who presents with flu-like symptoms and possible pneumonia.  Respiratory symptoms - Persistent productive cough for 3 days, similar to prior pneumonia episode last year - Difficulty taking a deep breath - No chest pain - No wheezing at home - Symptoms progressed from severe sinus congestion to involvement of the lungs - Reports positive flu B testing at home 3 days ago  Constitutional symptoms - Chills and night sweats - Poor sleep due to coughing - Decreased appetite but able to eat - No upset stomach  Recent exposures and risk factors - Recent travel through [place] - Three young children at home who may be sick contacts  Medication and vaccination history - No Tamiflu use - Received flu shot this year - Used DayQuil and NyQuil only, without extra Tylenol or ibuprofen - No adult pneumonia vaccine     ROS Per HPI      Objective:    BP 98/68 (BP Location: Left Arm, Patient Position: Sitting)   Pulse (!) 53   Temp 98.3 F (36.8 C) (Temporal)   Ht 6' 3.5 (1.918 m)   Wt 193 lb 9.6 oz (87.8 kg)   SpO2 99%   BMI 23.88 kg/m    Physical Exam Vitals and nursing note reviewed.  Constitutional:      General: He is not in acute distress.    Appearance: He is ill-appearing.  HENT:     Head: Normocephalic and atraumatic.     Right Ear: External ear normal.     Left Ear: External ear normal.     Nose: Congestion present.     Mouth/Throat:     Mouth: Mucous membranes are moist.     Pharynx: Posterior oropharyngeal erythema present.     Comments: Oropharyngeal cobblestoning   Eyes:      Extraocular Movements: Extraocular movements intact.  Cardiovascular:     Rate and Rhythm: Normal rate and regular rhythm.     Pulses: Normal pulses.     Heart sounds: Normal heart sounds.  Pulmonary:     Effort: Pulmonary effort is normal. No respiratory distress.     Breath sounds: Rales (Right middle and lower lobes) present. No wheezing or rhonchi.     Comments: Productive cough Musculoskeletal:        General: Normal range of motion.     Cervical back: Normal range of motion.     Right lower leg: No edema.     Left lower leg: No edema.  Lymphadenopathy:     Cervical: Cervical adenopathy present.  Skin:    General: Skin is warm and dry.  Neurological:     General: No focal deficit present.     Mental Status: He is alert and oriented to person, place, and time.  Psychiatric:        Mood and Affect: Mood normal.        Behavior: Behavior normal.     Results for orders placed or performed in visit on 12/17/24  POCT Influenza A/B  Result Value Ref Range   Influenza A,  POC Negative Negative   Influenza B, POC Positive (A) Negative        Assessment & Plan:   Assessment and Plan Assessment & Plan Influenza B, acute cough, fatigue  Influenza B with concern for pneumonia. Differential includes viral versus bacterial pneumonia. Antiviral treatment considered despite reduced efficacy on day three. - Ordered chest x-ray to evaluate for pneumonia. - Prescribed prednisone  for inflammation and breathing difficulty. - Prescribed cough syrup for symptomatic relief. - Will consider antibiotic treatment based on x-ray results. - Will communicate results via MyChart.  Pneumonia right middle lobe due to infectious organism - Meds as above - X-ray read is negative, I independently reviewed and examined x-ray - X-ray compared to February 2025 and has similar small hazy opacity to right middle lobe concerning for pneumonia, especially given clinical presentation lung sounds -  Very similar presentation to pneumonia last year - Will complete steroid, antibiotic as well as cough syrup - Augmentin  to pharmacy twice daily x 7 days    Orders Placed This Encounter  Procedures   DG Chest 2 View    Standing Status:   Future    Number of Occurrences:   1    Expiration Date:   06/16/2025    Reason for Exam (SYMPTOM  OR DIAGNOSIS REQUIRED):   cough, flu    Preferred imaging location?:   [facility]   POCT Influenza A/B     Meds ordered this encounter  Medications   predniSONE  (DELTASONE ) 20 MG tablet    Sig: Take 2 tablets (40 mg total) by mouth daily with breakfast for 5 days.    Dispense:  10 tablet    Refill:  0   HYDROcodone  bit-homatropine (HYCODAN) 5-1.5 MG/5ML syrup    Sig: Take 5 mLs by mouth every 8 (eight) hours as needed for cough.    Dispense:  120 mL    Refill:  0   amoxicillin -clavulanate (AUGMENTIN ) 875-125 MG tablet    Sig: Take 1 tablet by mouth 2 (two) times daily for 7 days.    Dispense:  14 tablet    Refill:  0    Return if symptoms worsen or fail to improve.  James LITTIE Ku, FNP  "

## 2024-12-17 NOTE — Patient Instructions (Addendum)
 I have sent in prednisone  for you to take 2 tablets once daily in the morning with breakfast for the next 5 days.  I have sent in hydrocodone  cough syrup for you to take 5 mL once daily in the evening as needed for cough.  This medication may make you sleepy.  Do not drive or operate heavy machinery while taking this medication.  We are getting an xray today. We will be in contact with any abnormal results that require further attention.   I have sent in Augmentin  for you to take twice a day for 7 days. This medication can upset your stomach, so I tell everyone to take it with a meal.

## 2025-01-06 ENCOUNTER — Encounter: Admitting: Family Medicine

## 2025-01-29 ENCOUNTER — Encounter: Admitting: Family Medicine
